# Patient Record
Sex: Male | Born: 1995 | Race: White | Hispanic: No | Marital: Single | Smoking: Former smoker
Health system: Southern US, Community
[De-identification: ages and names within clinical notes are randomized; demographics above are authoritative.]

## PROBLEM LIST (undated history)

## (undated) DIAGNOSIS — F319 Bipolar disorder, unspecified: Secondary | ICD-10-CM

## (undated) DIAGNOSIS — F410 Panic disorder [episodic paroxysmal anxiety] without agoraphobia: Secondary | ICD-10-CM

## (undated) DIAGNOSIS — I2699 Other pulmonary embolism without acute cor pulmonale: Secondary | ICD-10-CM

## (undated) HISTORY — PX: OTHER SURGICAL HISTORY: SHX169

---
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
## 2017-11-07 ENCOUNTER — Emergency Department
Admission: EM | Admit: 2017-11-07 | Discharge: 2017-11-07 | Disposition: A | Discharge status: Home / Self Care | Payer: Self-pay | Attending: Emergency Medicine | Admitting: Emergency Medicine

## 2017-11-07 ENCOUNTER — Encounter: Payer: Self-pay | Admitting: Emergency Medicine

## 2017-11-07 ENCOUNTER — Other Ambulatory Visit: Payer: Self-pay

## 2017-11-07 DIAGNOSIS — B2790 Infectious mononucleosis, unspecified without complication: Secondary | ICD-10-CM

## 2017-11-07 DIAGNOSIS — Z79899 Other long term (current) drug therapy: Secondary | ICD-10-CM | POA: Insufficient documentation

## 2017-11-07 DIAGNOSIS — F17210 Nicotine dependence, cigarettes, uncomplicated: Secondary | ICD-10-CM | POA: Insufficient documentation

## 2017-11-07 LAB — COMPREHENSIVE METABOLIC PANEL
ALBUMIN: 4.2 g/dL (ref 3.5–5.0)
ALT: 159 U/L — AB (ref 0–44)
AST: 116 U/L — AB (ref 15–41)
Alkaline Phosphatase: 103 U/L (ref 38–126)
Anion gap: 10 (ref 5–15)
BILIRUBIN TOTAL: 1 mg/dL (ref 0.3–1.2)
BUN: 9 mg/dL (ref 6–20)
CHLORIDE: 98 mmol/L (ref 98–111)
CO2: 30 mmol/L (ref 22–32)
Calcium: 9.2 mg/dL (ref 8.9–10.3)
Creatinine, Ser: 0.88 mg/dL (ref 0.61–1.24)
GFR calc Af Amer: >60 mL/min (ref >60)
GLUCOSE: 103 mg/dL — AB (ref 70–99)
POTASSIUM: 3.9 mmol/L (ref 3.5–5.1)
Sodium: 138 mmol/L (ref 135–145)
TOTAL PROTEIN: 8.3 g/dL — AB (ref 6.5–8.1)

## 2017-11-07 LAB — CBC WITH DIFFERENTIAL/PLATELET
BLASTS: 0 %
Band Neutrophils: 0 %
Basophils Absolute: 0 10*3/uL (ref 0–0.1)
Basophils Relative: 0 %
EOS PCT: 1 %
Eosinophils Absolute: 0.1 10*3/uL (ref 0–0.7)
HCT: 45.3 % (ref 40.0–52.0)
HEMOGLOBIN: 15.9 g/dL (ref 13.0–18.0)
Lymphocytes Relative: 52 %
Lymphs Abs: 6.4 10*3/uL — ABNORMAL HIGH (ref 1.0–3.6)
MCH: 28.7 pg (ref 26.0–34.0)
MCHC: 35 g/dL (ref 32.0–36.0)
MCV: 82.1 fL (ref 80.0–100.0)
MONO ABS: 1.7 10*3/uL — AB (ref 0.2–1.0)
MONOS PCT: 14 %
Metamyelocytes Relative: 0 %
Myelocytes: 1 %
NEUTROS ABS: 4.1 10*3/uL (ref 1.4–6.5)
NRBC: 0 /100{WBCs}
Neutrophils Relative %: 32 %
OTHER: 0 %
Platelets: 181 10*3/uL (ref 150–440)
Promyelocytes Relative: 0 %
RBC: 5.52 MIL/uL (ref 4.40–5.90)
RDW: 13.3 % (ref 11.5–14.5)
WBC: 12.3 10*3/uL — AB (ref 3.8–10.6)

## 2017-11-07 LAB — MONONUCLEOSIS SCREEN: Mono Screen: POSITIVE — AB

## 2017-11-07 LAB — GROUP A STREP BY PCR: Group A Strep by PCR: NOT DETECTED

## 2017-11-07 MED ORDER — DEXAMETHASONE SODIUM PHOSPHATE 10 MG/ML IJ SOLN
10.0000 mg | Freq: Once | INTRAMUSCULAR | Status: AC
  Administered 2017-11-07: 10 mg via INTRAVENOUS
  Filled 2017-11-07: qty 1

## 2017-11-07 MED ORDER — ACETAMINOPHEN 325 MG PO TABS
650.0000 mg | ORAL_TABLET | Freq: Once | ORAL | Status: AC | PRN
  Administered 2017-11-07: 650 mg via ORAL
  Filled 2017-11-07: qty 2

## 2017-11-07 NOTE — ED Provider Notes (Signed)
[facility] Emergency Department Provider Note   ____________________________________________   First MD Initiated Contact with Patient 11/07/17 1427     (approximate)  I have reviewed the triage vital signs and the nursing notes.   HISTORY  Chief Complaint Sore Throat  HPI Jason Wise is a 22 y.o. male to the emergency department with complaint of sore throat for approximately 4 days.  Patient arrives to the ED via EMS.  Patient has not taken any over-the-counter medication and complains of fever with sore throat.  He continues to drink normally.  He is unaware of any exposure to strep throat.  Currently rates his pain as an 8 out of 10.  Past Medical History:  Diagnosis Date  . Bipolar disorder (HCC)   . Panic attack     Patient Active Problem List   Diagnosis Date Noted  . Substance induced mood disorder (HCC) 05/08/2016  . Bipolar depression (HCC) 05/08/2016    History reviewed. No pertinent surgical history.  Prior to Admission medications   Medication Sig Start Date End Date Taking? Authorizing Provider  FLUoxetine (PROZAC) 10 MG capsule Take 1 capsule (10 mg total) by mouth at bedtime. 05/04/16   Myrna BlazerSchaevitz, David Matthew, MD  hydrOXYzine (ATARAX/VISTARIL) 25 MG tablet Take 1 tablet (25 mg total) by mouth 3 (three) times daily as needed. 09/09/16   Enid DerryWagner, Ashley, PA-C  OLANZapine (ZYPREXA) 2.5 MG tablet Take 0.5 tablets (1.25 mg total) by mouth at bedtime. 05/04/16 05/04/17  Schaevitz, Myra Rudeavid Matthew, MD    Allergies Patient has no known allergies.  History reviewed. No pertinent family history.  Social History Social History   Tobacco Use  . Smoking status: Current Every Day Smoker    Types: E-cigarettes, Cigarettes  . Smokeless tobacco: Never Used  Substance Use Topics  . Alcohol use: No  . Drug use: No    Review of Systems Constitutional: Positive fever/chills Eyes: No visual changes. ENT: Positive sore  throat. Cardiovascular: Denies chest pain. Respiratory: Denies shortness of breath. Gastrointestinal: No abdominal pain.  No nausea, no vomiting.  Musculoskeletal: Negative for back pain. Skin: Negative for rash. Neurological: Negative for headaches, focal weakness or numbness. ____________________________________________   PHYSICAL EXAM:  VITAL SIGNS: ED Triage Vitals  Enc Vitals Group     BP 11/07/17 1355 118/76     Pulse Rate 11/07/17 1355 (!) 128     Resp 11/07/17 1355 18     Temp 11/07/17 1355 (!) 102 F (38.9 C)     Temp Source 11/07/17 1355 Oral     SpO2 11/07/17 1355 100 %     Weight 11/07/17 1355 132 lb (59.9 kg)     Height 11/07/17 1355 5\' 9"  (1.753 m)     Head Circumference --      Peak Flow --      Pain Score 11/07/17 1410 8     Pain Loc --      Pain Edu? --      Excl. in GC? --    Constitutional: Alert and oriented. Well appearing and in no acute distress. Eyes: Conjunctivae are normal.  Head: Atraumatic. Nose: No congestion/rhinnorhea. Mouth/Throat: Mucous membranes are moist.  Oropharynx erythematous without exudate.  Uvula is midline.  Patient does not have any difficulty maintaining secretions and is drinking fluids. Neck: No stridor.   Hematological/Lymphatic/Immunilogical: Bilateral tender cervical lymphadenopathy. Cardiovascular: Normal rate, regular rhythm. Grossly normal heart sounds.  Good peripheral circulation. Respiratory: Normal respiratory effort.  No retractions. Lungs CTAB. Gastrointestinal:  Soft and nontender. No distention. Musculoskeletal: His upper and lower extremities with any difficulty.  Normal gait was noted. Neurologic:  Normal speech and language. No gross focal neurologic deficits are appreciated. No gait instability. Skin:  Skin is warm, dry and intact. No rash noted. Psychiatric: Mood and affect are normal. Speech and behavior are normal.  ____________________________________________   LABS (all labs ordered are listed, but  only abnormal results are displayed)  Labs Reviewed  COMPREHENSIVE METABOLIC PANEL - Abnormal; Notable for the following components:      Result Value   Glucose, Bld 103 (*)    Total Protein 8.3 (*)    AST 116 (*)    ALT 159 (*)    All other components within normal limits  CBC WITH DIFFERENTIAL/PLATELET - Abnormal; Notable for the following components:   WBC 12.3 (*)    Lymphs Abs 6.4 (*)    Monocytes Absolute 1.7 (*)    All other components within normal limits  MONONUCLEOSIS SCREEN - Abnormal; Notable for the following components:   Mono Screen POSITIVE (*)    All other components within normal limits  GROUP A STREP BY PCR    PROCEDURES  Procedure(s) performed: None  Procedures  Critical Care performed: No  ____________________________________________   INITIAL IMPRESSION / ASSESSMENT AND PLAN / ED COURSE  As part of my medical decision making, I reviewed the following data within the electronic MEDICAL RECORD NUMBER Notes from prior ED visits and [place] Controlled Substance Database  Patient presents to the ED with complaint of sore throat.  Patient was made aware that strep test is negative however mono test was positive.  He was made aware that he could not participate in any contact sports which he states there is no possibility of that as he is not athletic.  Patient was given Decadron 10 mg IV prior to discharge to help with throat discomfort.  He is encouraged to drink fluids and also follow-up with his PCP in approximately 3 weeks. ____________________________________________   FINAL CLINICAL IMPRESSION(S) / ED DIAGNOSES  Final diagnoses:  Infectious mononucleosis without complication, infectious mononucleosis due to unspecified organism     ED Discharge Orders    None       Note:  This document was prepared using Dragon voice recognition software and may include unintentional dictation errors.    Tommi Rumps, PA-C 11/07/17 1618    Emily Filbert, MD 11/10/17 [phone]

## 2017-11-07 NOTE — Discharge Instructions (Addendum)
Follow-up with your primary care provider or [facility] if any continued problems.  He may continue taking Tylenol or ibuprofen as needed for fever.  Increase fluids.  Eat nutritious foods and get plenty of rest.  Do not participate in any contact sports or activities which would cause a blow to your abdomen.  You should be reevaluated by your primary care provider in approximately 3 weeks.  Return to the emergency department if any severe worsening of your symptoms. Mononucleosis is a viral and will need to run its course on its own.  The steroids that you were given through your IV should help with throat discomfort.

## 2017-11-07 NOTE — ED Triage Notes (Signed)
First Nurse Notes:  Arrives via [facility] with c/o white spots on throat since Monday and fever.  EMS states temp:  99.5.

## 2018-02-19 IMAGING — DX DG ELBOW COMPLETE 3+V*R*
4 series · 4 of 4 positions shown · non-contrast
Comparison: None.

CLINICAL DATA: Pain following fall

EXAM:
RIGHT ELBOW - COMPLETE 3+ VIEW

[elbow ap]
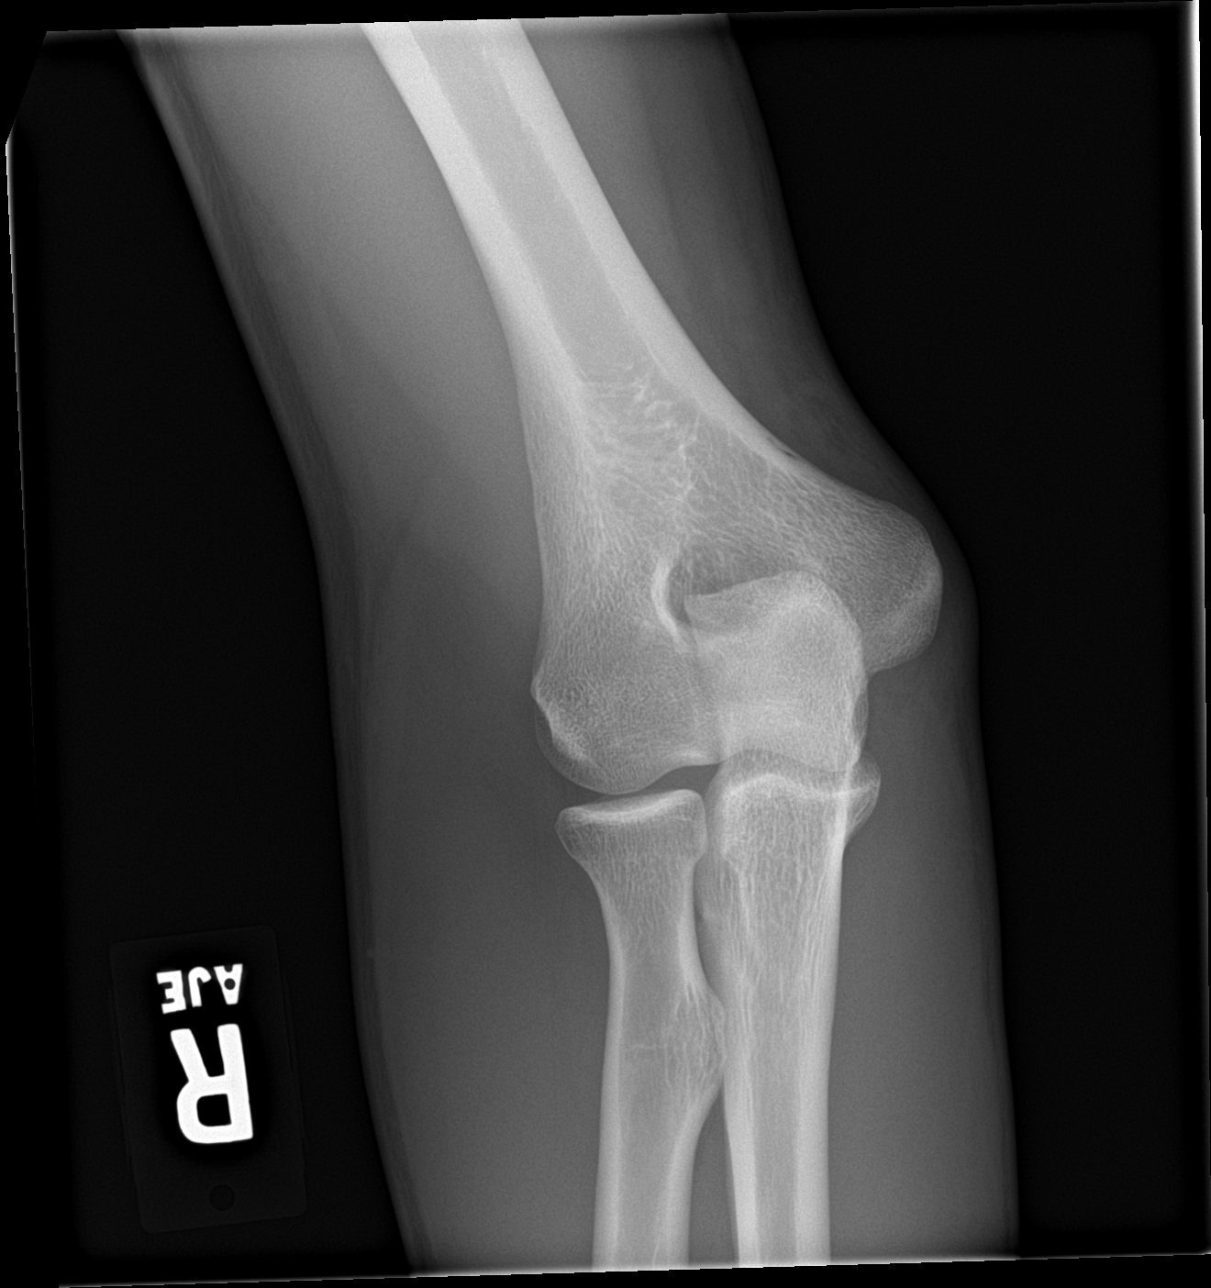

[elbow obl (1 of 2)]
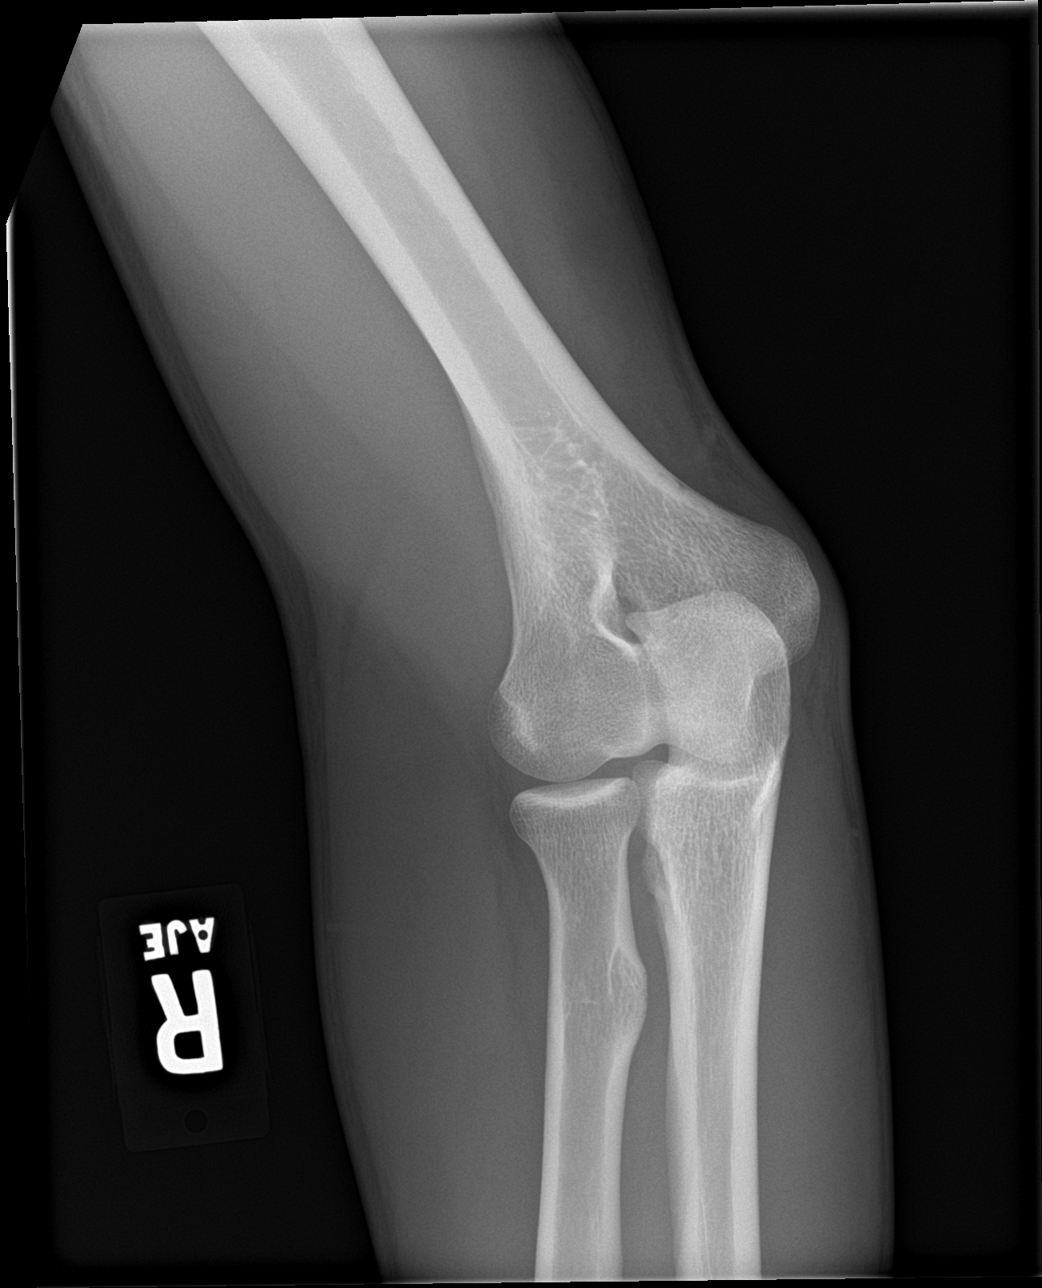

[elbow obl (2 of 2)]
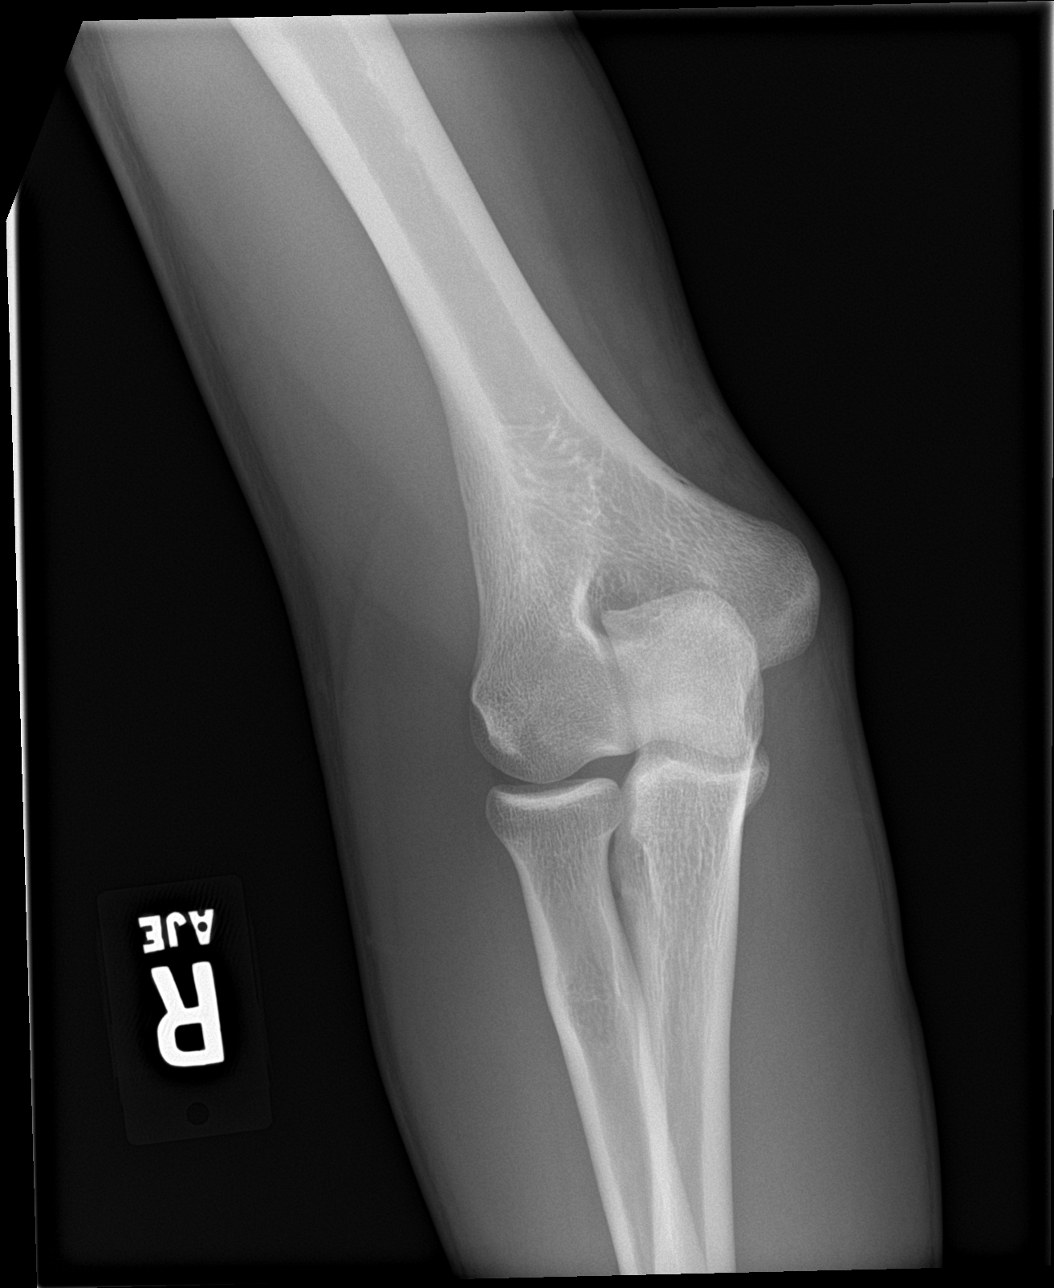

[elbow lat]
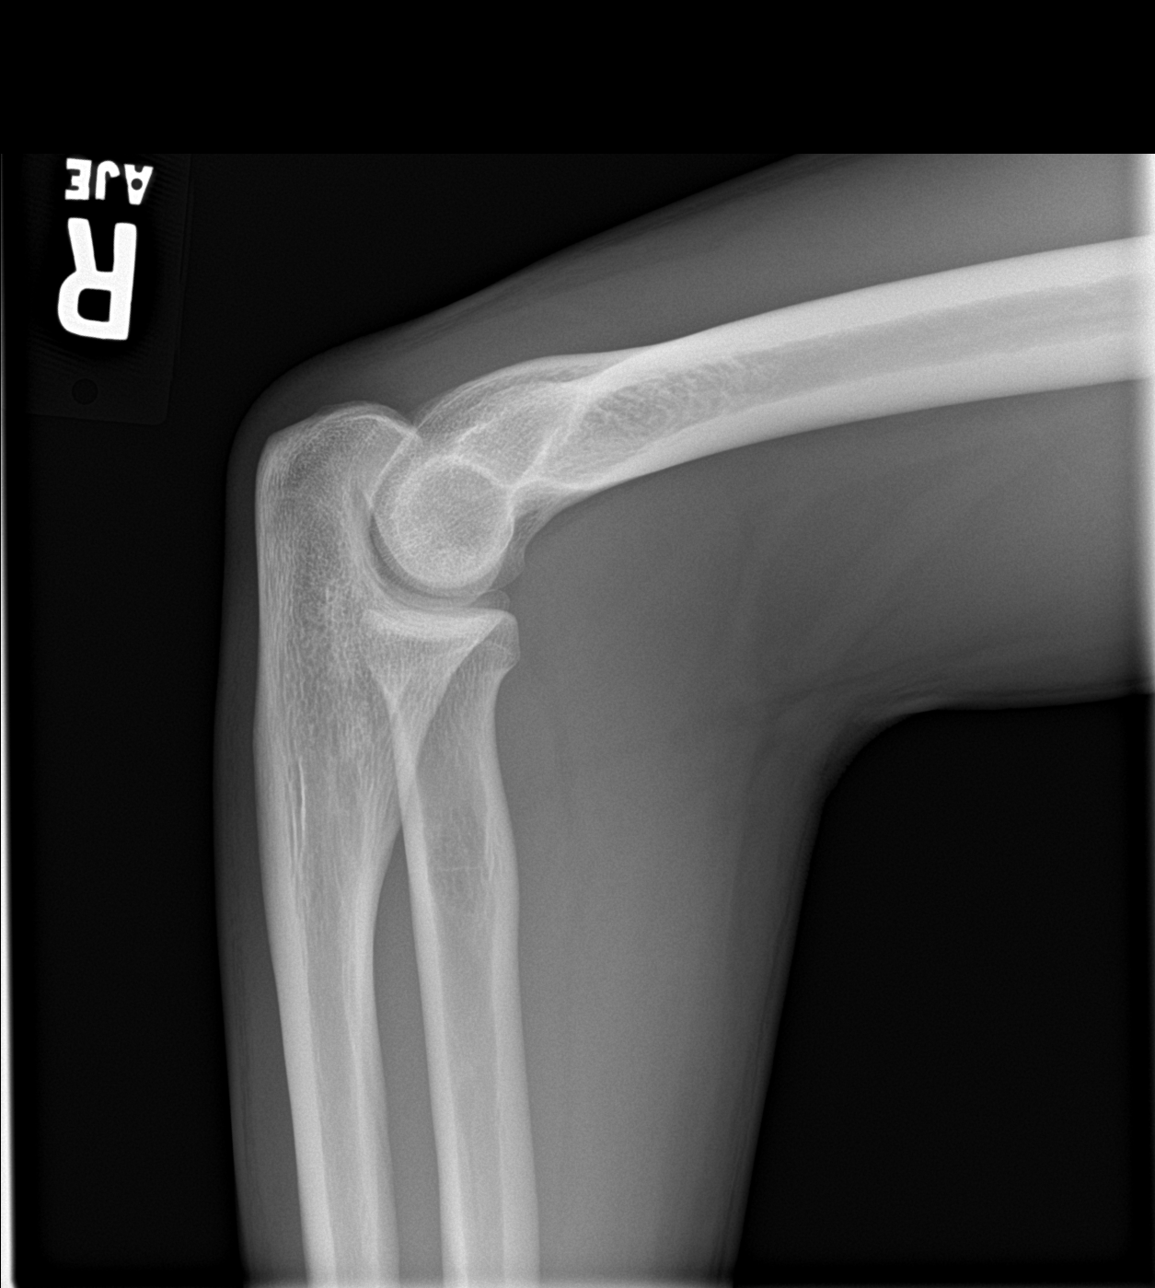

[4 of 4 positions shown; findings below may reference images not displayed]

FINDINGS: Frontal, lateral, and bilateral oblique views were obtained. There
is no fracture or dislocation. The joint spaces appear normal. No
joint effusion. No erosive change.
IMPRESSION: No fracture or dislocation.  No evident arthropathy.

## 2018-02-19 IMAGING — DX DG WRIST COMPLETE 3+V*R*
4 series · 4 of 4 positions shown · non-contrast
Comparison: None.

CLINICAL DATA: Fall during PE.  Right wrist pain and tingling.

EXAM:
RIGHT WRIST - COMPLETE 3+ VIEW

[wrist ap (1 of 2)]
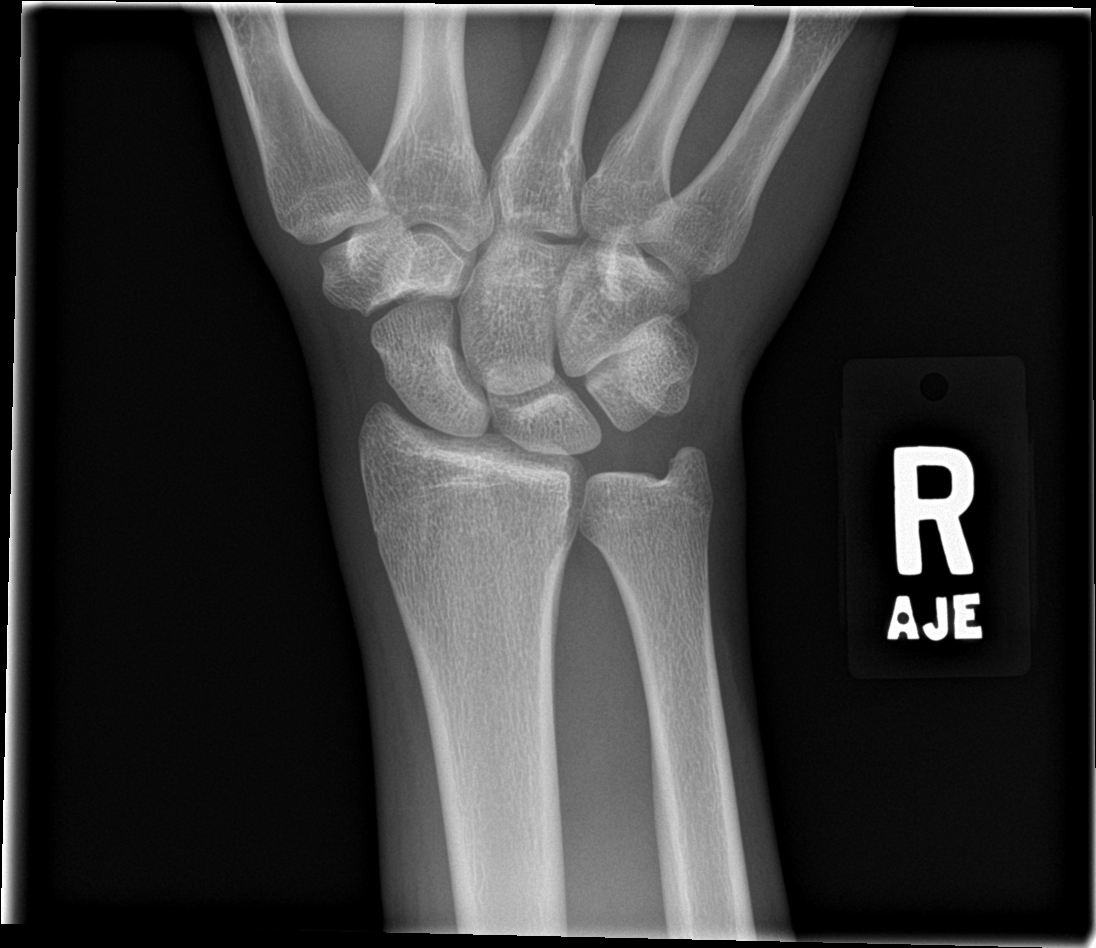

[wrist obl]
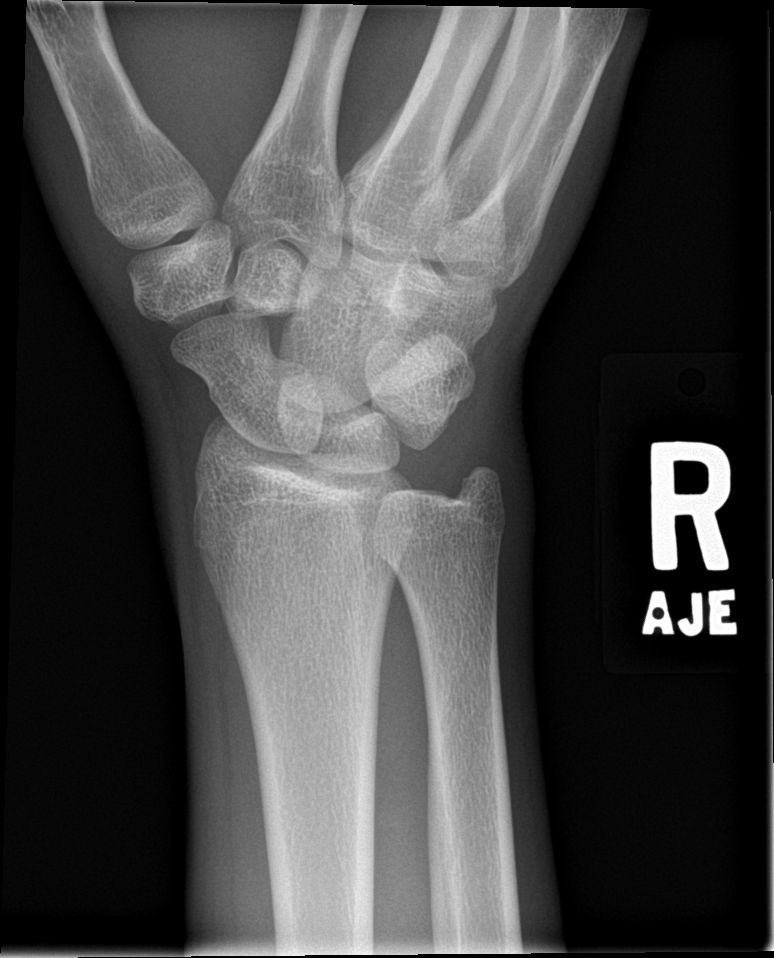

[wrist lat]
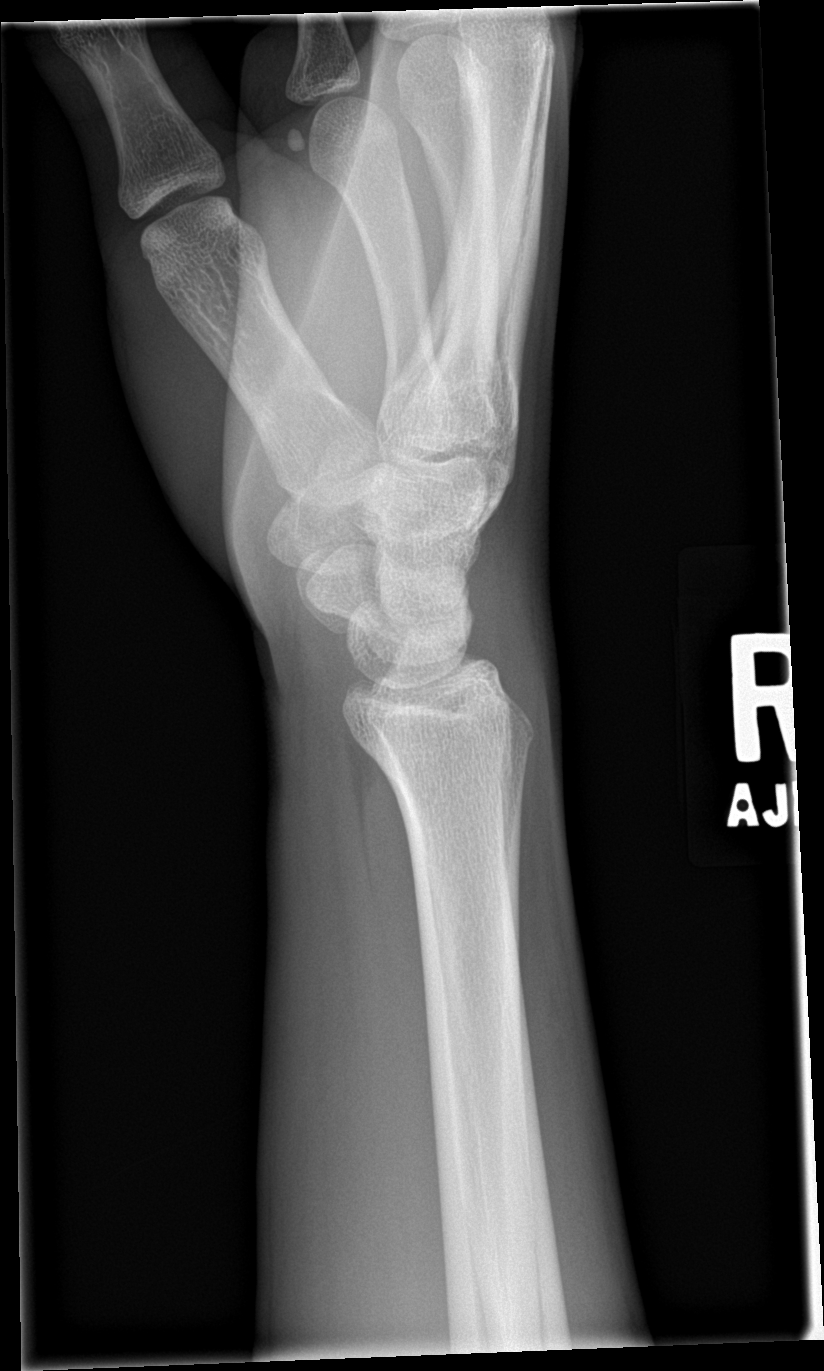

[wrist ap (2 of 2)]
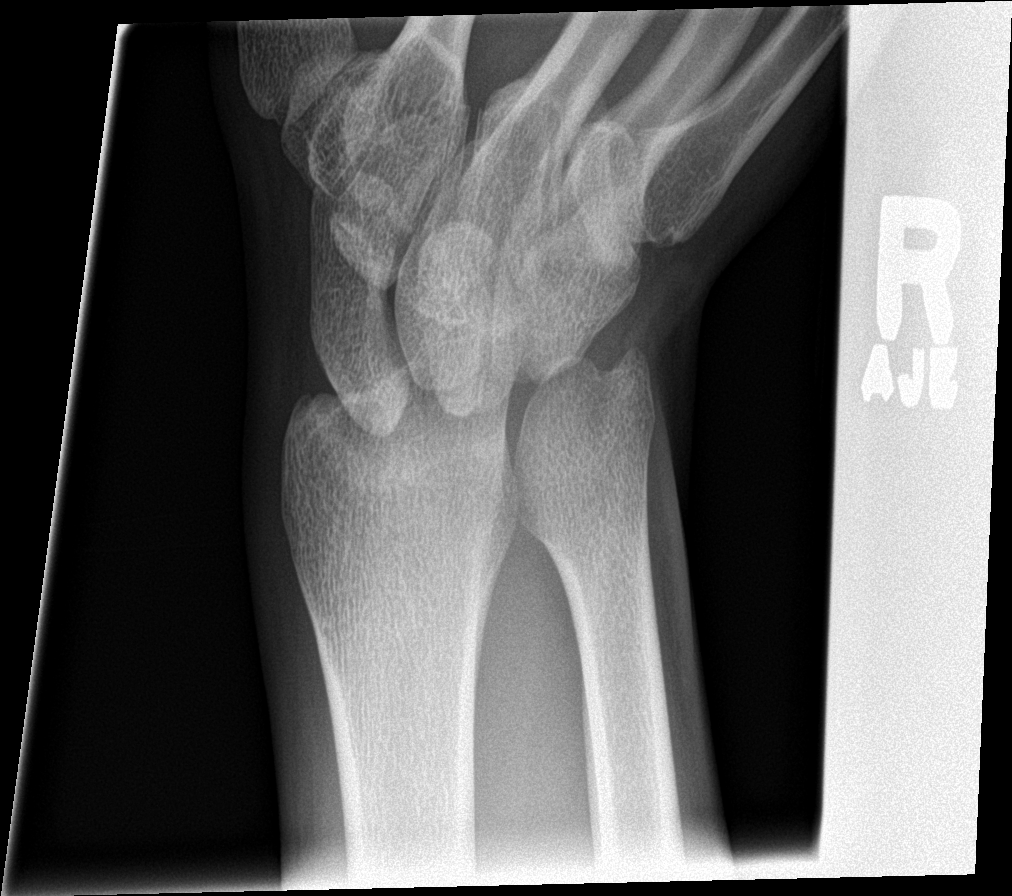

[4 of 4 positions shown; findings below may reference images not displayed]

FINDINGS: There is no evidence of fracture or dislocation. There is no
evidence of arthropathy or other focal bone abnormality. Soft
tissues are unremarkable.
IMPRESSION: Negative right wrist radiographs.

## 2018-05-12 ENCOUNTER — Emergency Department
Admission: EM | Admit: 2018-05-12 | Discharge: 2018-05-12 | Disposition: A | Discharge status: Home / Self Care | Payer: Self-pay | Attending: Emergency Medicine | Admitting: Emergency Medicine

## 2018-05-12 ENCOUNTER — Other Ambulatory Visit: Payer: Self-pay

## 2018-05-12 ENCOUNTER — Encounter: Payer: Self-pay | Admitting: Emergency Medicine

## 2018-05-12 DIAGNOSIS — F319 Bipolar disorder, unspecified: Secondary | ICD-10-CM | POA: Insufficient documentation

## 2018-05-12 DIAGNOSIS — J029 Acute pharyngitis, unspecified: Secondary | ICD-10-CM | POA: Insufficient documentation

## 2018-05-12 DIAGNOSIS — Z79899 Other long term (current) drug therapy: Secondary | ICD-10-CM | POA: Insufficient documentation

## 2018-05-12 DIAGNOSIS — R21 Rash and other nonspecific skin eruption: Secondary | ICD-10-CM

## 2018-05-12 DIAGNOSIS — F17210 Nicotine dependence, cigarettes, uncomplicated: Secondary | ICD-10-CM | POA: Insufficient documentation

## 2018-05-12 LAB — GROUP A STREP BY PCR: Group A Strep by PCR: NOT DETECTED

## 2018-05-12 MED ORDER — PREDNISONE 10 MG (21) PO TBPK: ORAL_TABLET | ORAL | 0 refills | Status: DC

## 2018-05-12 MED ORDER — AMOXICILLIN 500 MG PO CAPS: 500.0000 mg | ORAL_CAPSULE | Freq: Three times a day (TID) | ORAL | 0 refills | Status: DC

## 2018-05-12 NOTE — ED Provider Notes (Signed)
[facility] Emergency Department Provider Note  ____________________________________________   First MD Initiated Contact with Patient 05/12/18 1246     (approximate)  I have reviewed the triage vital signs and the nursing notes.   HISTORY  Chief Complaint Rash    HPI Jason Wise is a 23 y.o. male presents emergency department reporting a rash to the trunk and groin area.  He states the area appeared after he stayed in a hotel on Friday.  He states is not very itchy.  He states there is none on his hands under his arms around his waistline.  His mother states that he complained of sore throat last week.  He has had some diarrhea today.    Past Medical History:  Diagnosis Date  . Bipolar disorder (HCC)   . Panic attack     Patient Active Problem List   Diagnosis Date Noted  . Substance induced mood disorder (HCC) 05/08/2016  . Bipolar depression (HCC) 05/08/2016    History reviewed. No pertinent surgical history.  Prior to Admission medications   Medication Sig Start Date End Date Taking? Authorizing Provider  amoxicillin (AMOXIL) 500 MG capsule Take 1 capsule (500 mg total) by mouth 3 (three) times daily. 05/12/18   Fisher, Roselyn Bering, PA-C  FLUoxetine (PROZAC) 10 MG capsule Take 1 capsule (10 mg total) by mouth at bedtime. 05/04/16   Myrna Blazer, MD  hydrOXYzine (ATARAX/VISTARIL) 25 MG tablet Take 1 tablet (25 mg total) by mouth 3 (three) times daily as needed. 09/09/16   Enid Derry, PA-C  OLANZapine (ZYPREXA) 2.5 MG tablet Take 0.5 tablets (1.25 mg total) by mouth at bedtime. 05/04/16 05/04/17  Myrna Blazer, MD  predniSONE (STERAPRED UNI-PAK 21 TAB) 10 MG (21) TBPK tablet Take 6 pills on day one then decrease by 1 pill each day 05/12/18   Faythe Ghee, PA-C    Allergies Patient has no known allergies.  History reviewed. No pertinent family history.  Social History Social History   Tobacco Use  . Smoking status:  Current Every Day Smoker    Packs/day: 1.00    Types: Cigarettes  . Smokeless tobacco: Never Used  Substance Use Topics  . Alcohol use: No  . Drug use: No    Review of Systems  Constitutional: No fever/chills Eyes: No visual changes. ENT: Positive sore throat. Respiratory: Denies cough Genitourinary: Negative for dysuria. Musculoskeletal: Negative for back pain. Skin: Positive for rash.    ____________________________________________   PHYSICAL EXAM:  VITAL SIGNS: ED Triage Vitals  Enc Vitals Group     BP 05/12/18 1140 117/64     Pulse Rate 05/12/18 1140 (!) 101     Resp --      Temp 05/12/18 1140 98.6 F (37 C)     Temp Source 05/12/18 1140 Oral     SpO2 05/12/18 1140 99 %     Weight 05/12/18 1140 132 lb (59.9 kg)     Height 05/12/18 1140 5\' 9"  (1.753 m)     Head Circumference --      Peak Flow --      Pain Score 05/12/18 1145 0     Pain Loc --      Pain Edu? --      Excl. in GC? --     Constitutional: Alert and oriented. Well appearing and in no acute distress. Eyes: Conjunctivae are normal.  Head: Atraumatic. Nose: No congestion/rhinnorhea. Mouth/Throat: Mucous membranes are moist.  Throat is bright red and  swollen Neck:  supple no lymphadenopathy noted Cardiovascular: Normal rate, regular rhythm. Heart sounds are normal Respiratory: Normal respiratory effort.  No retractions, lungs c t a  Abd: soft nontender bs normal all 4 quad GU: deferred Musculoskeletal: FROM all extremities, warm and well perfused Neurologic:  Normal speech and language.  Skin:  Skin is warm, dry and intact.  The rash on the trunk has several round lesions noted, questionable like pityriasis rosea versus scarlet fever. Psychiatric: Mood and affect are normal. Speech and behavior are normal.  ____________________________________________   LABS (all labs ordered are listed, but only abnormal results are displayed)  Labs Reviewed  GROUP A STREP BY PCR    ____________________________________________   ____________________________________________  RADIOLOGY    ____________________________________________   PROCEDURES  Procedure(s) performed: No  Procedures    ____________________________________________   INITIAL IMPRESSION / ASSESSMENT AND PLAN / ED COURSE  Pertinent labs & imaging results that were available during my care of the patient were reviewed by me and considered in my medical decision making (see chart for details).   Patient is 23 year old male presents emergency department complaining of a rash, sore throat, and diarrhea.  Physical exam shows throat is bright red.  The rash is confined to the trunk and groin area.  Strep test is negative.  Due to the presentation the patient was placed on amoxicillin and Sterapred.  He is to follow-up with regular doctor if not better in 3 days.  Return emergency department worsening.  He states he understands will comply.  Is discharged stable condition.     As part of my medical decision making, I reviewed the following data within the electronic MEDICAL RECORD NUMBER History obtained from family, Nursing notes reviewed and incorporated, Labs reviewed strep test is negative, Old chart reviewed, Notes from prior ED visits and  Controlled Substance Database  ____________________________________________   FINAL CLINICAL IMPRESSION(S) / ED DIAGNOSES  Final diagnoses:  Acute pharyngitis, unspecified etiology  Rash      NEW MEDICATIONS STARTED DURING THIS VISIT:  Discharge Medication List as of 05/12/2018  2:04 PM    START taking these medications   Details  amoxicillin (AMOXIL) 500 MG capsule Take 1 capsule (500 mg total) by mouth 3 (three) times daily., Starting Tue 05/12/2018, Normal    predniSONE (STERAPRED UNI-PAK 21 TAB) 10 MG (21) TBPK tablet Take 6 pills on day one then decrease by 1 pill each day, Normal         Note:  This document was prepared using  Dragon voice recognition software and may include unintentional dictation errors.    Faythe Ghee, PA-C 05/12/18 1633    Sharyn Creamer, MD 05/12/18 [phone]

## 2018-05-12 NOTE — ED Triage Notes (Signed)
Pt presents to ED c/o rash to chest and back since Friday. Pt states rash burns slightly but is not itchy. States he was staying with a friend in a motel when rash first appeared.

## 2018-05-12 NOTE — ED Notes (Signed)
Rash like to anterior posterior trunk, reports rash radiating into groin area. Denies itch. Reports rash appeared on Friday, reports used a different type of body wash and noticed the rash after that. Rash worse to day.

## 2018-05-12 NOTE — Discharge Instructions (Addendum)
Follow up with your regular doctor or acute care if not better in 3 days.  Take the medication as prescribed.  If the rash is worsening return to the ER.

## 2018-06-24 ENCOUNTER — Encounter: Payer: Self-pay | Admitting: Emergency Medicine

## 2018-06-24 ENCOUNTER — Emergency Department: Payer: Self-pay

## 2018-06-24 ENCOUNTER — Emergency Department
Admission: EM | Admit: 2018-06-24 | Discharge: 2018-06-24 | Disposition: A | Discharge status: Home / Self Care | Payer: Self-pay | Attending: Emergency Medicine | Admitting: Emergency Medicine

## 2018-06-24 ENCOUNTER — Other Ambulatory Visit: Payer: Self-pay

## 2018-06-24 DIAGNOSIS — E860 Dehydration: Secondary | ICD-10-CM | POA: Insufficient documentation

## 2018-06-24 DIAGNOSIS — R0789 Other chest pain: Secondary | ICD-10-CM | POA: Insufficient documentation

## 2018-06-24 LAB — CBC WITH DIFFERENTIAL/PLATELET
Abs Immature Granulocytes: 0.04 10*3/uL (ref 0.00–0.07)
Basophils Absolute: 0.1 10*3/uL (ref 0.0–0.1)
Basophils Relative: 1 %
Eosinophils Absolute: 0.1 10*3/uL (ref 0.0–0.5)
Eosinophils Relative: 1 %
HCT: 49.7 % (ref 39.0–52.0)
Hemoglobin: 16.6 g/dL (ref 13.0–17.0)
Immature Granulocytes: 0 %
Lymphocytes Relative: 28 %
Lymphs Abs: 2.9 10*3/uL (ref 0.7–4.0)
MCH: 29 pg (ref 26.0–34.0)
MCHC: 33.4 g/dL (ref 30.0–36.0)
MCV: 86.9 fL (ref 80.0–100.0)
Monocytes Absolute: 0.8 10*3/uL (ref 0.1–1.0)
Monocytes Relative: 8 %
Neutro Abs: 6.3 10*3/uL (ref 1.7–7.7)
Neutrophils Relative %: 62 %
Platelets: 289 10*3/uL (ref 150–400)
RBC: 5.72 MIL/uL (ref 4.22–5.81)
RDW: 12.7 % (ref 11.5–15.5)
WBC: 10.2 10*3/uL (ref 4.0–10.5)
nRBC: 0 % (ref 0.0–0.2)

## 2018-06-24 LAB — COMPREHENSIVE METABOLIC PANEL
ALT: 12 U/L (ref 0–44)
AST: 22 U/L (ref 15–41)
Albumin: 5 g/dL (ref 3.5–5.0)
Alkaline Phosphatase: 50 U/L (ref 38–126)
Anion gap: 13 (ref 5–15)
BUN: 20 mg/dL (ref 6–20)
CO2: 25 mmol/L (ref 22–32)
Calcium: 9.8 mg/dL (ref 8.9–10.3)
Chloride: 102 mmol/L (ref 98–111)
Creatinine, Ser: 0.83 mg/dL (ref 0.61–1.24)
GFR calc Af Amer: >60 mL/min (ref >60)
GFR calc non Af Amer: >60 mL/min (ref >60)
Glucose, Bld: 109 mg/dL — ABNORMAL HIGH (ref 70–99)
Potassium: 3.8 mmol/L (ref 3.5–5.1)
Sodium: 140 mmol/L (ref 135–145)
Total Bilirubin: 0.4 mg/dL (ref 0.3–1.2)
Total Protein: 8 g/dL (ref 6.5–8.1)

## 2018-06-24 LAB — FIBRIN DERIVATIVES D-DIMER (ARMC ONLY): Fibrin derivatives D-dimer (ARMC): 120.99 ng/mL (FEU) (ref 0.00–499.00)

## 2018-06-24 MED ORDER — SODIUM CHLORIDE 0.9 % IV BOLUS
1000.0000 mL | Freq: Once | INTRAVENOUS | Status: AC
  Administered 2018-06-24: 1000 mL via INTRAVENOUS

## 2018-06-24 MED ORDER — KETOROLAC TROMETHAMINE 30 MG/ML IJ SOLN
15.0000 mg | INTRAMUSCULAR | Status: DC
  Filled 2018-06-24: qty 1

## 2018-06-24 NOTE — ED Notes (Signed)
Pt verbalized understanding of d/c instructions, and f/u care. No further questions at this time. Pt ambulatory to the exit with steady gait.  

## 2018-06-24 NOTE — ED Provider Notes (Signed)
[facility] Emergency Department Provider Note  ____________________________________________  Time seen: Approximately 6:32 PM  I have reviewed the triage vital signs and the nursing notes.   HISTORY  Chief Complaint Chest Pain    HPI Jason Wise is a 23 y.o. male with a history of bipolar disorder, panic attack, and substance abuse who reports he was riding a bike today after about 10 minutes had a sudden onset of severe left-sided chest pain that was sharp, constant, now improved to 5/10.  Nonradiating.  Not associated with shortness of breath diaphoresis or vomiting.  No palpitations dizziness or syncope.  Never had anything like this before. Denies recent travel trauma hospitalization surgery or prior DVT or PE.   Denies any past medical history or surgeries.  Reports that he takes no medications currently.  Denies hallucinations. Patient reports he has not been drinking water much lately and may be dehydrated.  Past Medical History:  Diagnosis Date  . Bipolar disorder (HCC)   . Panic attack      Patient Active Problem List   Diagnosis Date Noted  . Substance induced mood disorder (HCC) 05/08/2016  . Bipolar depression (HCC) 05/08/2016     History reviewed. No pertinent surgical history.   Prior to Admission medications   Medication Sig Start Date End Date Taking? Authorizing Provider  amoxicillin (AMOXIL) 500 MG capsule Take 1 capsule (500 mg total) by mouth 3 (three) times daily. Patient not taking: Reported on 06/24/2018 05/12/18   Faythe GheeFisher, Susan W, PA-C  FLUoxetine (PROZAC) 10 MG capsule Take 1 capsule (10 mg total) by mouth at bedtime. Patient not taking: Reported on 06/24/2018 05/04/16   Myrna BlazerSchaevitz, David Matthew, MD  hydrOXYzine (ATARAX/VISTARIL) 25 MG tablet Take 1 tablet (25 mg total) by mouth 3 (three) times daily as needed. Patient not taking: Reported on 06/24/2018 09/09/16   Enid DerryWagner, Ashley, PA-C  OLANZapine (ZYPREXA) 2.5 MG tablet Take  0.5 tablets (1.25 mg total) by mouth at bedtime. 05/04/16 05/04/17  Myrna BlazerSchaevitz, David Matthew, MD  predniSONE (STERAPRED UNI-PAK 21 TAB) 10 MG (21) TBPK tablet Take 6 pills on day one then decrease by 1 pill each day Patient not taking: Reported on 06/24/2018 05/12/18   Faythe GheeFisher, Susan W, PA-C     Allergies Patient has no known allergies.   History reviewed. No pertinent family history.  Social History Social History   Tobacco Use  . Smoking status: Current Every Day Smoker    Packs/day: 1.00    Types: Cigarettes  . Smokeless tobacco: Never Used  Substance Use Topics  . Alcohol use: No  . Drug use: No  Occasional marijuana use.  Denies cocaine or amphetamines.  Review of Systems  Constitutional:   No fever or chills.  ENT:   No sore throat. No rhinorrhea. Cardiovascular:   Positive as above chest pain without syncope. Respiratory:   No dyspnea or cough. Gastrointestinal:   Negative for abdominal pain, vomiting and diarrhea.  Musculoskeletal:   Negative for focal pain or swelling All other systems reviewed and are negative except as documented above in ROS and HPI.  ____________________________________________   PHYSICAL EXAM:  VITAL SIGNS: ED Triage Vitals  Enc Vitals Group     BP 06/24/18 1820 (!) 130/92     Pulse Rate 06/24/18 1823 (!) 116     Resp 06/24/18 1823 14     Temp 06/24/18 1820 98.3 F (36.8 C)     Temp Source 06/24/18 1820 Oral     SpO2 06/24/18 1823  96 %     Weight 06/24/18 1820 132 lb 0.9 oz (59.9 kg)     Height 06/24/18 1820 5\' 9"  (1.753 m)     Head Circumference --      Peak Flow --      Pain Score 06/24/18 1819 5     Pain Loc --      Pain Edu? --      Excl. in GC? --     Vital signs reviewed, nursing assessments reviewed.   Constitutional:   Alert and oriented. Non-toxic appearance. Eyes:   Conjunctivae are normal. EOMI. PERRL. ENT      Head:   Normocephalic and atraumatic.      Nose:   No congestion/rhinnorhea.       Mouth/Throat:    Tachycardia heart rate 120, no pharyngeal erythema. No peritonsillar mass.       Neck:   No meningismus. Full ROM. Hematological/Lymphatic/Immunilogical:   No cervical lymphadenopathy. Cardiovascular:   RRR. Symmetric bilateral radial and DP pulses.  No murmurs. Cap refill less than 2 seconds. Respiratory:   Normal respiratory effort without tachypnea/retractions. Breath sounds are clear and equal bilaterally. No wheezes/rales/rhonchi. Gastrointestinal:   Soft and nontender. Non distended. There is no CVA tenderness.  No rebound, rigidity, or guarding.  Musculoskeletal:   Normal range of motion in all extremities. No joint effusions.  No lower extremity tenderness.  No edema. Neurologic:   Normal speech and language.  Motor grossly intact. No acute focal neurologic deficits are appreciated.  Skin:    Skin is warm, dry and intact. No rash noted.  No petechiae, purpura, or bullae.  ____________________________________________    LABS (pertinent positives/negatives) (all labs ordered are listed, but only abnormal results are displayed) Labs Reviewed  COMPREHENSIVE METABOLIC PANEL - Abnormal; Notable for the following components:      Result Value   Glucose, Bld 109 (*)    All other components within normal limits  CBC WITH DIFFERENTIAL/PLATELET  FIBRIN DERIVATIVES D-DIMER ([facility] ONLY)   ____________________________________________   EKG  Interpreted by me Sinus tachycardia rate 109, right axis, normal intervals.  Normal QRS ST segments and T waves.  Not consistent with right heart strain.  ____________________________________________    RADIOLOGY  Dg Chest Portable 1 View  Result Date: 06/24/2018 CLINICAL DATA:  23 y/o  M; chest pain, tachycardia. EXAM: PORTABLE CHEST 1 VIEW COMPARISON:  05/04/2016 chest radiograph. FINDINGS: Stable heart size and mediastinal contours are within normal limits. Both lungs are clear. The visualized skeletal structures are unremarkable. IMPRESSION:  No active disease. Electronically Signed   By: Mitzi Hansen M.D.   On: 06/24/2018 19:10    ____________________________________________   PROCEDURES Procedures  ____________________________________________  DIFFERENTIAL DIAGNOSIS   Pneumothorax, pleurisy, chest wall muscle spasm, pulmonary embolism  CLINICAL IMPRESSION / ASSESSMENT AND PLAN / ED COURSE  Medications ordered in the ED: Medications  ketorolac (TORADOL) 30 MG/ML injection 15 mg (15 mg Intravenous Refused 06/24/18 1832)  sodium chloride 0.9 % bolus 1,000 mL (1,000 mLs Intravenous New Bag/Given 06/24/18 1832)    Pertinent labs & imaging results that were available during my care of the patient were reviewed by me and considered in my medical decision making (see chart for details).  Jason Wise was evaluated in Emergency Department on 06/24/2018 for the symptoms described in the history of present illness. He was evaluated in the context of the global COVID-19 pandemic, which necessitated consideration that the patient might be at risk for infection with the SARS-CoV-2  virus that causes COVID-19. Institutional protocols and algorithms that pertain to the evaluation of patients at risk for COVID-19 are in a state of rapid change based on information released by regulatory bodies including the CDC and federal and state organizations. These policies and algorithms were followed during the patient's care in the ED.   Patient overall nontoxic, but presents with sudden onset of sharp chest pain and tachycardia.  Exam is unremarkable except for tachycardia.  Will check chest x-ray and labs including d-dimer.  IV fluids for hydration.   ----------------------------------------- 8:21 PM on 06/24/2018 -----------------------------------------  Work-up negative.  D-dimer unremarkable.  On reassessment patient reports that his pain is entirely gone.  Heart rate is improved to 100 after IV fluids.  He feels much better and  is eager to be discharged, and he is stable for outpatient follow-up.  Low suspicion for ACS dissection AAA pneumothorax pericarditis or pneumonia.     ____________________________________________   FINAL CLINICAL IMPRESSION(S) / ED DIAGNOSES    Final diagnoses:  Atypical chest pain  Dehydration     ED Discharge Orders    None      Portions of this note were generated with dragon dictation software. Dictation errors may occur despite best attempts at proofreading.   Sharman Cheek, MD 06/24/18 2021

## 2018-06-24 NOTE — ED Notes (Signed)
X-ray at bedside

## 2018-06-24 NOTE — ED Triage Notes (Signed)
Pt presents from home via [facility] with c/o chest pain while riding bike. Pt denies shortness of breath or fevers. Pt states pain is in upper left chest. pt describes pain as sharp.

## 2018-12-29 ENCOUNTER — Encounter: Payer: Self-pay | Admitting: Emergency Medicine

## 2018-12-29 ENCOUNTER — Other Ambulatory Visit: Payer: Self-pay

## 2018-12-29 ENCOUNTER — Emergency Department
Admission: EM | Admit: 2018-12-29 | Discharge: 2018-12-29 | Disposition: A | Discharge status: Other Acute Inpatient Hospital | Payer: Self-pay | Attending: Emergency Medicine | Admitting: Emergency Medicine

## 2018-12-29 DIAGNOSIS — F4320 Adjustment disorder, unspecified: Secondary | ICD-10-CM | POA: Insufficient documentation

## 2018-12-29 DIAGNOSIS — Z23 Encounter for immunization: Secondary | ICD-10-CM | POA: Insufficient documentation

## 2018-12-29 DIAGNOSIS — F329 Major depressive disorder, single episode, unspecified: Secondary | ICD-10-CM | POA: Insufficient documentation

## 2018-12-29 DIAGNOSIS — F419 Anxiety disorder, unspecified: Secondary | ICD-10-CM | POA: Insufficient documentation

## 2018-12-29 DIAGNOSIS — F32A Depression, unspecified: Secondary | ICD-10-CM

## 2018-12-29 DIAGNOSIS — F17210 Nicotine dependence, cigarettes, uncomplicated: Secondary | ICD-10-CM | POA: Insufficient documentation

## 2018-12-29 DIAGNOSIS — F1994 Other psychoactive substance use, unspecified with psychoactive substance-induced mood disorder: Secondary | ICD-10-CM | POA: Diagnosis present

## 2018-12-29 DIAGNOSIS — F319 Bipolar disorder, unspecified: Secondary | ICD-10-CM | POA: Diagnosis present

## 2018-12-29 DIAGNOSIS — R45851 Suicidal ideations: Secondary | ICD-10-CM

## 2018-12-29 DIAGNOSIS — Z20828 Contact with and (suspected) exposure to other viral communicable diseases: Secondary | ICD-10-CM | POA: Insufficient documentation

## 2018-12-29 LAB — COMPREHENSIVE METABOLIC PANEL
ALT: 14 U/L (ref 0–44)
AST: 21 U/L (ref 15–41)
Albumin: 4.8 g/dL (ref 3.5–5.0)
Alkaline Phosphatase: 54 U/L (ref 38–126)
Anion gap: 9 (ref 5–15)
BUN: 11 mg/dL (ref 6–20)
CO2: 31 mmol/L (ref 22–32)
Calcium: 9.8 mg/dL (ref 8.9–10.3)
Chloride: 102 mmol/L (ref 98–111)
Creatinine, Ser: 0.96 mg/dL (ref 0.61–1.24)
GFR calc Af Amer: >60 mL/min (ref >60)
GFR calc non Af Amer: >60 mL/min (ref >60)
Glucose, Bld: 119 mg/dL — ABNORMAL HIGH (ref 70–99)
Potassium: 4.3 mmol/L (ref 3.5–5.1)
Sodium: 142 mmol/L (ref 135–145)
Total Bilirubin: 0.9 mg/dL (ref 0.3–1.2)
Total Protein: 7.8 g/dL (ref 6.5–8.1)

## 2018-12-29 LAB — URINE DRUG SCREEN, QUALITATIVE (ARMC ONLY)
Amphetamines, Ur Screen: NOT DETECTED
Barbiturates, Ur Screen: NOT DETECTED
Benzodiazepine, Ur Scrn: NOT DETECTED
Cannabinoid 50 Ng, Ur ~~LOC~~: POSITIVE — AB
Cocaine Metabolite,Ur ~~LOC~~: NOT DETECTED
MDMA (Ecstasy)Ur Screen: NOT DETECTED
Methadone Scn, Ur: NOT DETECTED
Opiate, Ur Screen: NOT DETECTED
Phencyclidine (PCP) Ur S: NOT DETECTED
Tricyclic, Ur Screen: NOT DETECTED

## 2018-12-29 LAB — CBC
HCT: 48.9 % (ref 39.0–52.0)
Hemoglobin: 16.2 g/dL (ref 13.0–17.0)
MCH: 28.8 pg (ref 26.0–34.0)
MCHC: 33.1 g/dL (ref 30.0–36.0)
MCV: 87 fL (ref 80.0–100.0)
Platelets: 288 10*3/uL (ref 150–400)
RBC: 5.62 MIL/uL (ref 4.22–5.81)
RDW: 12.8 % (ref 11.5–15.5)
WBC: 17.6 10*3/uL — ABNORMAL HIGH (ref 4.0–10.5)
nRBC: 0 % (ref 0.0–0.2)

## 2018-12-29 LAB — ETHANOL: Alcohol, Ethyl (B): <10 mg/dL (ref <10)

## 2018-12-29 LAB — SALICYLATE LEVEL: Salicylate Lvl: <7 mg/dL (ref 2.8–30.0)

## 2018-12-29 LAB — SARS CORONAVIRUS 2 BY RT PCR (HOSPITAL ORDER, PERFORMED IN ~~LOC~~ HOSPITAL LAB): SARS Coronavirus 2: NEGATIVE

## 2018-12-29 LAB — ACETAMINOPHEN LEVEL: Acetaminophen (Tylenol), Serum: <10 ug/mL — ABNORMAL LOW (ref 10–30)

## 2018-12-29 MED ORDER — HYDROXYZINE HCL 25 MG PO TABS: 25.0000 mg | ORAL_TABLET | Freq: Four times a day (QID) | ORAL | Status: DC | PRN

## 2018-12-29 MED ORDER — TETANUS-DIPHTH-ACELL PERTUSSIS 5-2.5-18.5 LF-MCG/0.5 IM SUSP
0.5000 mL | Freq: Once | INTRAMUSCULAR | Status: AC
  Administered 2018-12-29: 0.5 mL via INTRAMUSCULAR
  Filled 2018-12-29: qty 0.5

## 2018-12-29 MED ORDER — NICOTINE 21 MG/24HR TD PT24
21.0000 mg | MEDICATED_PATCH | Freq: Once | TRANSDERMAL | Status: DC
  Administered 2018-12-29: 21 mg via TRANSDERMAL
  Filled 2018-12-29: qty 1

## 2018-12-29 MED ORDER — LORAZEPAM 1 MG PO TABS
1.0000 mg | ORAL_TABLET | Freq: Once | ORAL | Status: AC
  Administered 2018-12-29: 1 mg via ORAL
  Filled 2018-12-29: qty 1

## 2018-12-29 NOTE — ED Provider Notes (Signed)
-----------------------------------------   2:52 PM on 12/29/2018 -----------------------------------------  Blood pressure 121/75, pulse 77, temperature 98.6 F (37 C), temperature source Oral, resp. rate 18, height 5\' 9"  (1.753 m), weight 62.1 kg, SpO2 100 %.  Plan for patient to be admitted to behavioral health unit following evaluation by psychiatry.  At this time, patient is requesting to leave although there is still concern for his suicidal ideation and he still represents a threat to himself.  Will place patient under IVC.  Patient reevaluated by psychiatry, who agreed with plan and will proceed with admission.   Blake Divine, MD 12/29/18 [phone]

## 2018-12-29 NOTE — ED Notes (Signed)
Hourly rounding reveals patient in room. No complaints, stable, in no acute distress. Q15 minute rounds and monitoring via Rover and Officer to continue.   

## 2018-12-29 NOTE — ED Notes (Signed)
BEHAVIORAL HEALTH ROUNDING Patient sleeping: Yes.   Patient alert and oriented: eyes closed  Appears asleep Behavior appropriate: Yes.  ; If no, describe:  Nutrition and fluids offered: Yes  Toileting and hygiene offered: sleeping Sitter present: q 15 minute observations  Law enforcement present: yes  ACSD  ENVIRONMENTAL ASSESSMENT Potentially harmful objects out of patient reach: Yes.   Personal belongings secured: Yes.   Patient dressed in hospital provided attire only: Yes.   Plastic bags out of patient reach: Yes.   Patient care equipment (cords, cables, call bells, lines, and drains) shortened, removed, or accounted for: Yes.   Equipment and supplies removed from bottom of stretcher: Yes.   Potentially toxic materials out of patient reach: Yes.   Sharps container removed or out of patient reach: Yes.   

## 2018-12-29 NOTE — ED Notes (Signed)
BEHAVIORAL HEALTH ROUNDING Patient sleeping: No. Patient alert and oriented: yes Behavior appropriate: Yes.  ; If no, describe:  Nutrition and fluids offered: yes Toileting and hygiene offered: Yes  Sitter present: q15 minute observations  

## 2018-12-29 NOTE — ED Notes (Signed)
Referral information for Psychiatric Hospitalization faxed to;   . [facility] ([phone]-or- [phone]),   . [facility] [phone] -or- [phone]) [phone]  . [facility] ([phone]---[phone]---[phone]),  . [facility] ([phone], [phone], [phone] or [phone]),   . [place] [facility] ([phone] or [phone])  . [facility] ([phone]),   . [facility] ([phone] -or- [phone]),   . [facility] ([phone] or [phone])  . [facility] ([phone])  

## 2018-12-29 NOTE — ED Notes (Signed)
Patient observed lying in bed with eyes closed  Even, unlabored respirations observed   NAD pt appears to be sleeping  I will continue to monitor along with every 15 minute visual observations     

## 2018-12-29 NOTE — ED Notes (Signed)
With male officer present, pt removes black footies, white tennis shoes, green t-shirt, red/black jacket, gold tone necklace & earring; pt to keep jeans and cloth belt; placed burgandy t-shirt on pt

## 2018-12-29 NOTE — ED Notes (Signed)
[facility]  DEPT  CALLED  FOR  TRANSFER 

## 2018-12-29 NOTE — ED Notes (Addendum)
ED Is the patient under IVC or is there intent for IVC:  Voluntary  Is the patient medically cleared: Yes.   Is there vacancy in the ED BHU: Yes.   Is the population mix appropriate for patient: Yes.   Is the patient awaiting placement in inpatient or outpatient setting: Yes.  Awaiting placement on [facility]U  Signed and held orders are in place  Has the patient had a psychiatric consult: Yes.   Survey of unit performed for contraband, proper placement and condition of furniture, tampering with fixtures in bathroom, shower, and each patient room: Yes.  ; Findings:  APPEARANCE/BEHAVIOR Calm and cooperative NEURO ASSESSMENT Orientation: oriented x3  Denies pain Hallucinations: No.None noted (Hallucinations) denies  Speech: Normal Gait: normal RESPIRATORY ASSESSMENT Even  Unlabored respirations  CARDIOVASCULAR ASSESSMENT Pulses equal   regular rate  Skin warm and dry   GASTROINTESTINAL ASSESSMENT no GI complaint EXTREMITIES Full ROM  PLAN OF CARE Provide calm/safe environment. Vital signs assessed twice daily. ED BHU Assessment once each 12-hour shift. Collaborate with TTS daily or as condition indicates. Assure the ED provider has rounded once each shift. Provide and encourage hygiene. Provide redirection as needed. Assess for escalating behavior; address immediately and inform ED provider.  Assess family dynamic and appropriateness for visitation as needed: Yes.  ; If necessary, describe findings:  Educate the patient/family about BHU procedures/visitation: Yes.  ; If necessary, describe findings:

## 2018-12-29 NOTE — ED Notes (Signed)
Pt  Placed under  ivc papers  Informed  Amy  Teague  RN

## 2018-12-29 NOTE — BH Assessment (Signed)
This Probation officer received a call from pt's nurse and states patient is requesting to be seen by psych after being seen by psych and assessed within the last 24 hours. This information was relayed to ED psych.

## 2018-12-29 NOTE — ED Notes (Signed)
BEHAVIORAL HEALTH ROUNDING Patient sleeping: Yes.   Patient alert and oriented: eyes closed  Appears asleep Behavior appropriate: Yes.  ; If no, describe:  Nutrition and fluids offered: Yes  Toileting and hygiene offered: sleeping Sitter present: q 15 minute observations  Law enforcement present: yes   

## 2018-12-29 NOTE — Consult Note (Signed)
[facility]   Reason for Consult: Suicidal ideation Referring Physician: Dr. York CeriseForbach Patient Identification: Jason Groomsndrew T Haycraft MRN:  [id number] Principal Diagnosis: Suicidal ideation Diagnosis:  Principal Problem:   Suicidal ideation Active Problems:   Substance induced mood disorder (HCC)   Bipolar depression (HCC)   Total Time spent with patient: 45 minutes  Subjective: "I was dating an underage girl, and I got arrested.  I now have 2 class C felonies for dating her." Jason Groomsndrew T Polizzi is a [age] y.o. male patient [facility] ED via law enforcement voluntary for suicidal ideation with a plan and self injurious behavior (with many cut marks on left upper arm). The patient states he was depressed due to his legal charges pending dating an underaged male (21 years old) last summer.  He says that he was 23 years old during that period and did not realize it was a problem because her family approved of them dating.  The patient states it was one evening he was having medical issues and called the ambulance to come and get him, and when they got there, the young victim was in the background crying.  He voiced police intervened and realized the girl was a minor, and the patient is an adult having a girl in an unsafe environment.  He stated he went to jail.  He reports doing that time.  His dad died suddenly due to him choking on a mini Twix bar.  He noted that his dad passed away in July 2019.  The patient states he is unemployed, unable to locate a job due to his pending charges.  He says he dropped out of high school three months shy of graduating due to increased panic attacks while in school. The patient was seen face-to-face by this provider; chart reviewed and consulted with Dr. York CeriseForbach on 12/29/2018 due to the patient's care. It was discussed with her EDP that the patient does meet the criteria to be admitted to the psychiatric inpatient unit.  The patient is alert and  oriented x4, anxious, cooperative, and mood-congruent with affect on evaluation. The patient does not appear to be responding to internal or external stimuli. Neither is the patient presenting with any delusional thinking. The patient admits to auditory and visual hallucinations.  The patient discloses the voices he hears are muffled and not saying anything directly to him.  He states his visual hallucinations most time he sees his deceased father staring at him, but nothing is said.  The patient admits to suicidal and self-harm ideations but denies homicidal ideations. The patient is not presenting with any psychotic or paranoid behaviors. During an encounter with the patient, he was able to answer questions appropriately.  Plan: The patient is a safety risk to self and does require psychiatric inpatient admission for stabilization and treatment. HPI: per Dr. York CeriseForbach; Jason Wise is a 23 y.o. male who reports a history of bipolar disorder and panic attacks who presents voluntarily accompanied by law enforcement for evaluation of depression and suicidal ideation.  He says that he has been depressed for a long time, months and months, but just recently over the last 24 hours ago it became so severe that he was having thoughts of killing himself.  When asked about specific methods he discussed, he said "a lot of them".  He knew he needed help so he called 911.  He denies alcohol use and says that he smokes marijuana but no other drugs.  He denies any  contact with COVID-19 patients.  He denies fever/chills, sore throat, chest pain, shortness of breath, cough, nausea, vomiting, abdominal pain, and dysuria.  Nothing particular makes his symptoms better or worse.  He says he does not have a psychiatrist and does not take any psychiatric medications at this time (although multiple psychiatric medications are listed on his medical history).  Of note, the patient has a number of superficial scratches on his left upper  arm that he said he did to try to feel something.    Past Psychiatric History:  Bipolar disorder ((HCC) Panic attack  Risk to Self:  Yes Risk to Others:  No Prior Inpatient Therapy:  Yes Prior Outpatient Therapy:   No  Past Medical History:  Past Medical History:  Diagnosis Date  . Bipolar disorder (HCC)   . Panic attack    History reviewed. No pertinent surgical history. Family History: No family history on file. Family Psychiatric  History: Unknown Social History:  Social History   Substance and Sexual Activity  Alcohol Use No     Social History   Substance and Sexual Activity  Drug Use No    Social History   Socioeconomic History  . Marital status: Single    Spouse name: Not on file  . Number of children: Not on file  . Years of education: Not on file  . Highest education level: Not on file  Occupational History  . Not on file  Social Needs  . Financial resource strain: Not on file  . Food insecurity    Worry: Not on file    Inability: Not on file  . Transportation needs    Medical: Not on file    Non-medical: Not on file  Tobacco Use  . Smoking status: Current Every Day Smoker    Packs/day: 1.00    Types: Cigarettes  . Smokeless tobacco: Never Used  Substance and Sexual Activity  . Alcohol use: No  . Drug use: No  . Sexual activity: Not on file  Lifestyle  . Physical activity    Days per week: Not on file    Minutes per session: Not on file  . Stress: Not on file  Relationships  . Social Musician on phone: Not on file    Gets together: Not on file    Attends religious service: Not on file    Active member of club or organization: Not on file    Attends meetings of clubs or organizations: Not on file    Relationship status: Not on file  Other Topics Concern  . Not on file  Social History Narrative  . Not on file   Additional Social History:    Allergies:  No Known Allergies  Labs:  Results for orders placed or performed  during the hospital encounter of 12/29/18 (from the past 48 hour(s))  Comprehensive metabolic panel     Status: Abnormal   Collection Time: 12/29/18  3:25 AM  Result Value Ref Range   Sodium 142 135 - 145 mmol/L   Potassium 4.3 3.5 - 5.1 mmol/L   Chloride 102 98 - 111 mmol/L   CO2 31 22 - 32 mmol/L   Glucose, Bld 119 (H) 70 - 99 mg/dL   BUN 11 6 - 20 mg/dL   Creatinine, Ser 3.90 0.61 - 1.24 mg/dL   Calcium 9.8 8.9 - 30.0 mg/dL   Total Protein 7.8 6.5 - 8.1 g/dL   Albumin 4.8 3.5 - 5.0 g/dL   AST  21 15 - 41 U/L   ALT 14 0 - 44 U/L   Alkaline Phosphatase 54 38 - 126 U/L   Total Bilirubin 0.9 0.3 - 1.2 mg/dL   GFR calc non Af Amer >60 >60 mL/min   GFR calc Af Amer >60 >60 mL/min   Anion gap 9 5 - 15    Comment: Performed at [facility], [address] [place], [place] [address]  Ethanol     Status: None   Collection Time: 12/29/18  3:25 AM  Result Value Ref Range   Alcohol, Ethyl (B) <10 <10 mg/dL    Comment: (NOTE) Lowest detectable limit for serum alcohol is 10 mg/dL. For medical purposes only. Performed at [facility] [address] [place], [place] [address]   Salicylate level     Status: None   Collection Time: 12/29/18  3:25 AM  Result Value Ref Range   Salicylate Lvl <7.0 2.8 - 30.0 mg/dL    Comment: Performed at [facility], [address] [place], [place] [address]  Acetaminophen level     Status: Abnormal   Collection Time: 12/29/18  3:25 AM  Result Value Ref Range   Acetaminophen (Tylenol), Serum <10 (L) 10 - 30 ug/mL    Comment: (NOTE) Therapeutic concentrations vary significantly. A range of 10-30 ug/mL  may be an effective concentration for many patients. However, some  are best treated at concentrations outside of this range. Acetaminophen concentrations >150 ug/mL at 4 hours after ingestion  and >50 ug/mL at 12 hours after ingestion are often associated with  toxic reactions. Performed at [facility], [address] [place], [place] [address]   cbc     Status: Abnormal   Collection Time: 12/29/18  3:25 AM  Result Value Ref Range   WBC 17.6 (H) 4.0 - 10.5 K/uL   RBC 5.62 4.22 - 5.81 MIL/uL   Hemoglobin 16.2 13.0 - 17.0 g/dL   HCT 29.5 62.1 - 30.8 %   MCV 87.0 80.0 - 100.0 fL   MCH 28.8 26.0 - 34.0 pg   MCHC 33.1 30.0 - 36.0 g/dL   RDW 65.7 84.6 - 96.2 %   Platelets 288 150 - 400 K/uL   nRBC 0.0 0.0 - 0.2 %    Comment: Performed at [facility], [address], [place], [place] [address]  Urine Drug Screen, Qualitative     Status: Abnormal   Collection Time: 12/29/18  3:25 AM  Result Value Ref Range   Tricyclic, Ur Screen NONE DETECTED NONE DETECTED   Amphetamines, Ur Screen NONE DETECTED NONE DETECTED   MDMA (Ecstasy)Ur Screen NONE DETECTED NONE DETECTED   Cocaine Metabolite,Ur [facility] NONE DETECTED NONE DETECTED   Opiate, Ur Screen NONE DETECTED NONE DETECTED   Phencyclidine (PCP) Ur S NONE DETECTED NONE DETECTED   Cannabinoid 50 Ng, Ur [place] POSITIVE (A) NONE DETECTED   Barbiturates, Ur Screen NONE DETECTED NONE DETECTED   Benzodiazepine, Ur Scrn NONE DETECTED NONE DETECTED   Methadone Scn, Ur NONE DETECTED NONE DETECTED    Comment: (NOTE) Tricyclics + metabolites, urine    Cutoff 1000 ng/mL Amphetamines + metabolites, urine  Cutoff 1000 ng/mL MDMA (Ecstasy), urine              Cutoff 500 ng/mL Cocaine Metabolite, urine          Cutoff 300 ng/mL Opiate + metabolites, urine        Cutoff 300 ng/mL Phencyclidine (PCP), urine  Cutoff 25 ng/mL Cannabinoid, urine                 Cutoff 50 ng/mL Barbiturates + metabolites, urine  Cutoff 200 ng/mL Benzodiazepine, urine              Cutoff 200 ng/mL Methadone, urine                   Cutoff 300 ng/mL The urine drug screen provides only a preliminary, unconfirmed analytical test result and should not be used for non-medical purposes. Clinical consideration and professional judgment should be applied to any positive drug  screen result due to possible interfering substances. A more specific alternate chemical method must be used in order to obtain a confirmed analytical result. Gas chromatography / mass spectrometry (GC/MS) is the preferred confirmat ory method. Performed at [facility], [address] [place], [place] [address]     Current Facility-Administered Medications  Medication Dose Route Frequency Provider Last Rate Last Dose  . hydrOXYzine (ATARAX/VISTARIL) tablet 25 mg  25 mg Oral Q6H PRN Caroline Sauger, NP       Current Outpatient Medications  Medication Sig Dispense Refill  . amoxicillin (AMOXIL) 500 MG capsule Take 1 capsule (500 mg total) by mouth 3 (three) times daily. (Patient not taking: Reported on 06/24/2018) 30 capsule 0  . FLUoxetine (PROZAC) 10 MG capsule Take 1 capsule (10 mg total) by mouth at bedtime. (Patient not taking: Reported on 06/24/2018) 15 capsule 0  . hydrOXYzine (ATARAX/VISTARIL) 25 MG tablet Take 1 tablet (25 mg total) by mouth 3 (three) times daily as needed. (Patient not taking: Reported on 06/24/2018) 30 tablet 0  . OLANZapine (ZYPREXA) 2.5 MG tablet Take 0.5 tablets (1.25 mg total) by mouth at bedtime. 15 tablet 0  . predniSONE (STERAPRED UNI-PAK 21 TAB) 10 MG (21) TBPK tablet Take 6 pills on day one then decrease by 1 pill each day (Patient not taking: Reported on 06/24/2018) 21 tablet 0    Musculoskeletal: Strength & Muscle Tone: within normal limits Gait & Station: normal Patient leans: N/A  Psychiatric Specialty Exam: Physical Exam  Nursing note and vitals reviewed. Constitutional: He is oriented to person, place, and time. He appears well-developed.  Neck: Normal range of motion. Neck supple.  Neurological: He is alert and oriented to person, place, and time.    Review of Systems  Psychiatric/Behavioral: Positive for depression and suicidal ideas.  All other systems reviewed and are negative.   Blood pressure (!) 145/80, pulse (!)  109, temperature 98.6 F (37 C), temperature source Oral, resp. rate 18, height 5\' 9"  (1.753 m), weight 62.1 kg, SpO2 99 %.Body mass index is 20.23 kg/m.  General Appearance: Casual  Eye Contact:  Good  Speech:  Clear and Coherent  Volume:  Decreased  Mood:  Anxious, Depressed, Hopeless and Worthless  Affect:  Congruent  Thought Process:  Coherent  Orientation:  Full (Time, Place, and Person)  Thought Content:  Logical and Rumination  Suicidal Thoughts:  Yes.  with intent/plan  Homicidal Thoughts:  No  Memory:  Immediate;   Good Recent;   Good Remote;   Good  Judgement:  Fair  Insight:  Fair  Psychomotor Activity:  Normal  Concentration:  Concentration: Fair  Recall:  [place] of Knowledge:  Fair  Language:  Good  Akathisia:  Negative  Handed:  Right  AIMS (if indicated):     Assets:  Communication Skills Desire for Improvement Leisure Time Resilience Social Support  ADL's:  Intact  Cognition:  WNL  Sleep:   Insomnia     Treatment Plan Summary: Medication management and Plan The patient meets criteria for psychiatric inpatient admission.  Disposition: Recommend psychiatric Inpatient admission when medically cleared. Supportive therapy provided about ongoing stressors.  Gillermo Murdoch, NP 12/29/2018 5:07 AM

## 2018-12-29 NOTE — ED Triage Notes (Addendum)
Patient ambulatory to triage with steady gait, without difficulty or distress noted, brought in by [facility] PD voluntarily for SI with hx of same; pt with superficial cuts to left upper arm

## 2018-12-29 NOTE — BH Assessment (Addendum)
Patient has been accepted to [facility]  Patient assigned to [facility] Accepting physician is Dr. Patrick Jupiter.  Call report to [phone]  Representative was Clarene Critchley.   ER Staff is aware of it:  Nitchia, ER Secretary  Dr. Charna Archer, ER MD  Amy T., Patient's Nurse      Accepting facility requested for IVC paperwork to be faxed to [phone]     Address: [address], [place] [address] [phone]

## 2018-12-29 NOTE — BH Assessment (Signed)
Assessment Note  Jason Wise is an 23 y.o. male. Jason Wise arrived to the ED by way of [facility] department.  He reports, "I was having suicidal thoughts".  He denied currently having a plan.  He states, "I was sitting by myself thinking, that is what triggered". He reports that this has happened before.  He shared that it happens when he is in a room by himself thinking.  He reports that he is depressed.  He has been feeling depression for approximately 1 year, since his father's death. He shared that he gets upset and angry, I go off on people, and I cut myself.  He reports symptoms of anxiety.  He states that being around a lot of people make him anxious. He states that he gets panic attack. He has difficulty breathing and his chest tightens and he get "dizzyheaded". He denied having auditory or visual hallucinations.   He denied having homicidal ideation or intent. He reports that he does not use drugs, and uses alcohol responsibly.  He reports that he is under stress due to his arrest twice in the past 2 months. He shared that he has a "Class C Felonoy".  Diagnosis: Depression, Anxiety  Past Medical History:  Past Medical History:  Diagnosis Date  . Bipolar disorder ([place])   . Panic attack     History reviewed. No pertinent surgical history.  Family History: No family history on file.  Social History:  reports that he has been smoking cigarettes. He has been smoking about 1.00 pack per day. He has never used smokeless tobacco. He reports that he does not drink alcohol or use drugs.  Additional Social History:  Alcohol / Drug Use History of alcohol / drug use?: No history of alcohol / drug abuse  CIWA: CIWA-Ar BP: (!) 145/80 Pulse Rate: (!) 109 COWS:    Allergies: No Known Allergies  Home Medications: (Not in a hospital admission)   OB/GYN Status:  No LMP for male patient.  General Assessment Data Location of Assessment: [facility] ED TTS Assessment: In system Is this a Tele or  Face-to-Face Assessment?: Face-to-Face Is this an Initial Assessment or a Re-assessment for this encounter?: Initial Assessment Patient Accompanied by:: N/A Language Other than English: No Living Arrangements: Other (Comment)(Private residence) What gender do you identify as?: Male Marital status: Single Living Arrangements: Non-relatives/Friends Can pt return to current living arrangement?: Yes Admission Status: Voluntary Is patient capable of signing voluntary admission?: Yes Referral Source: Self/Family/Friend Insurance type: None  Medical Screening Exam ([place]) Medical Exam completed: Yes  Crisis Care Plan Living Arrangements: Non-relatives/Friends Legal Guardian: Other:(Self) Name of Psychiatrist: None Name of Therapist: None  Education Status Is patient currently in school?: No Is the patient employed, unemployed or receiving disability?: Unemployed  Risk to self with the past 6 months Suicidal Ideation: Yes-Currently Present Has patient been a risk to self within the past 6 months prior to admission? : No Suicidal Intent: No-Not Currently/Within Last 6 Months Has patient had any suicidal intent within the past 6 months prior to admission? : No Is patient at risk for suicide?: Yes Suicidal Plan?: Yes-Currently Present Has patient had any suicidal plan within the past 6 months prior to admission? : Yes Specify Current Suicidal Plan: Cut himself in a main artery Access to Means: No What has been your use of drugs/alcohol within the last 12 months?: Occassional Alcohol usage Previous Attempts/Gestures: No How many times?: 0 Other Self Harm Risks: Denied Triggers for Past  Attempts: Unknown Intentional Self Injurious Behavior: None Family Suicide History: No Recent stressful life event(s): Legal Issues, Financial Problems Persecutory voices/beliefs?: No Depression: Yes Depression Symptoms: Despondent Substance abuse history and/or treatment for substance  abuse?: No Suicide prevention information given to non-admitted patients: Not applicable  Risk to Others within the past 6 months Homicidal Ideation: No Does patient have any lifetime risk of violence toward others beyond the six months prior to admission? : No Thoughts of Harm to Others: No Current Homicidal Intent: No Current Homicidal Plan: No Access to Homicidal Means: No Identified Victim: None identified History of harm to others?: No Assessment of Violence: None Noted Does patient have access to weapons?: No Criminal Charges Pending?: Yes Describe Pending Criminal Charges: Pleas [name], exploitations of minor Does patient have a court date: Yes Court Date: 02/15/19 Is patient on probation?: No  Psychosis Hallucinations: None noted Delusions: None noted  Mental Status Report Appearance/Hygiene: In scrubs Eye Contact: Fair Motor Activity: Unremarkable Speech: Logical/coherent Level of Consciousness: Alert Mood: Anxious Affect: Irritable Anxiety Level: Minimal Thought Processes: Coherent Judgement: Partial Orientation: Appropriate for developmental age Obsessive Compulsive Thoughts/Behaviors: None  Cognitive Functioning Concentration: Fair Memory: Recent Intact Is patient IDD: No Insight: Poor Impulse Control: Fair Appetite: Good Have you had any weight changes? : No Change Sleep: Decreased Vegetative Symptoms: None  ADLScreening [facility] Assessment Services) Patient's cognitive ability adequate to safely complete daily activities?: Yes Patient able to express need for assistance with ADLs?: Yes Independently performs ADLs?: Yes (appropriate for developmental age)  Prior Inpatient Therapy Prior Inpatient Therapy: No  Prior Outpatient Therapy Prior Outpatient Therapy: No Does patient have an ACCT team?: No Does patient have Intensive In-House Services?  : No Does patient have [facility] services? : No Does patient have P4CC services?:  No  ADL Screening (condition at time of admission) Patient's cognitive ability adequate to safely complete daily activities?: Yes Is the patient deaf or have difficulty hearing?: No Does the patient have difficulty seeing, even when wearing glasses/contacts?: No Does the patient have difficulty concentrating, remembering, or making decisions?: No Patient able to express need for assistance with ADLs?: Yes Does the patient have difficulty dressing or bathing?: No Independently performs ADLs?: Yes (appropriate for developmental age) Does the patient have difficulty walking or climbing stairs?: No Weakness of Legs: None Weakness of Arms/Hands: None  Home Assistive Devices/Equipment Home Assistive Devices/Equipment: None    Abuse/Neglect Assessment (Assessment to be complete while patient is alone) Abuse/Neglect Assessment Can Be Completed: (Denies a history of abuse.)     Advance Directives (For Healthcare) Does Patient Have a Medical Advance Directive?: No Would patient like information on creating a medical advance directive?: No - Patient declined          Disposition:  Disposition Initial Assessment Completed for this Encounter: Yes  On Site Evaluation by:   Reviewed with Physician:    Justice Deeds 12/29/2018 5:24 AM

## 2018-12-29 NOTE — ED Provider Notes (Signed)
[facility] Emergency Department Provider Note  ____________________________________________   First MD Initiated Contact with Patient 12/29/18 [phone]     (approximate)  I have reviewed the triage vital signs and the nursing notes.   HISTORY  Chief Complaint Suicidal ideation   HPI Jason Wise is a [age] y.o. male who reports a history of bipolar disorder and panic attacks who presents voluntarily accompanied by law enforcement for evaluation of depression and suicidal ideation.  He says that he has been depressed for a long time, months and months, but just recently over the last 24 hours ago it became so severe that he was having thoughts of killing himself.  When asked about specific methods he discussed, he said "a lot of them".   He knew he needed help so he called 911.  He denies alcohol use and says that he smokes marijuana but no other drugs.  He denies any contact with COVID-19 patients.  He denies fever/chills, sore throat, chest pain, shortness of breath, cough, nausea, vomiting, abdominal pain, and dysuria.  Nothing particular makes his symptoms better or worse.  He says he does not have a psychiatrist and does not take any psychiatric medications at this time (although multiple psychiatric medications are listed on his medical history).  Of note, the patient has a number of superficial scratches on his left upper arm that he said he did to try to feel something.        Past Medical History:  Diagnosis Date  . Bipolar disorder (HCC)   . Panic attack     Patient Active Problem List   Diagnosis Date Noted  . Suicidal ideation 12/29/2018  . Substance induced mood disorder (HCC) 05/08/2016  . Bipolar depression (HCC) 05/08/2016    History reviewed. No pertinent surgical history.  Prior to Admission medications   Medication Sig Start Date End Date Taking? Authorizing Provider  amoxicillin (AMOXIL) 500 MG capsule Take 1 capsule (500 mg total) by  mouth 3 (three) times daily. Patient not taking: Reported on 06/24/2018 05/12/18   Faythe GheeFisher, Susan W, PA-C  FLUoxetine (PROZAC) 10 MG capsule Take 1 capsule (10 mg total) by mouth at bedtime. Patient not taking: Reported on 06/24/2018 05/04/16   Myrna BlazerSchaevitz, David Matthew, MD  hydrOXYzine (ATARAX/VISTARIL) 25 MG tablet Take 1 tablet (25 mg total) by mouth 3 (three) times daily as needed. Patient not taking: Reported on 06/24/2018 09/09/16   Enid DerryWagner, Ashley, PA-C  OLANZapine (ZYPREXA) 2.5 MG tablet Take 0.5 tablets (1.25 mg total) by mouth at bedtime. 05/04/16 05/04/17  Myrna BlazerSchaevitz, David Matthew, MD  predniSONE (STERAPRED UNI-PAK 21 TAB) 10 MG (21) TBPK tablet Take 6 pills on day one then decrease by 1 pill each day Patient not taking: Reported on 06/24/2018 05/12/18   Faythe GheeFisher, Susan W, PA-C    Allergies Patient has no known allergies.  No family history on file.  Social History Social History   Tobacco Use  . Smoking status: Current Every Day Smoker    Packs/day: 1.00    Types: Cigarettes  . Smokeless tobacco: Never Used  Substance Use Topics  . Alcohol use: No  . Drug use: No    Review of Systems Constitutional: No fever/chills Eyes: No visual changes. ENT: No sore throat. Cardiovascular: Denies chest pain. Respiratory: Denies shortness of breath. Gastrointestinal: No abdominal pain.  No nausea, no vomiting.  No diarrhea.  No constipation. Genitourinary: Negative for dysuria. Musculoskeletal: Negative for neck pain.  Negative for back pain. Integumentary: Self-inflicted superficial  lacerations to the left upper arm Neurological: Negative for headaches, focal weakness or numbness. Psychiatric:  Depression, SI.  ____________________________________________   PHYSICAL EXAM:  VITAL SIGNS: ED Triage Vitals  Enc Vitals Group     BP 12/29/18 0323 (!) 145/80     Pulse Rate 12/29/18 0323 (!) 109     Resp 12/29/18 0323 18     Temp 12/29/18 0323 98.6 F (37 C)     Temp Source 12/29/18 0323  Oral     SpO2 12/29/18 0323 99 %     Weight 12/29/18 0322 62.1 kg (137 lb)     Height 12/29/18 0322 1.753 m (5\' 9" )     Head Circumference --      Peak Flow --      Pain Score 12/29/18 0322 0     Pain Loc --      Pain Edu? --      Excl. in GC? --     Constitutional: Alert and oriented.  Calm, no acute distress. Eyes: Conjunctivae are normal.  Head: Atraumatic. Nose: No congestion/rhinnorhea. Mouth/Throat: Mucous membranes are moist. Neck: No stridor.  No meningeal signs.   Cardiovascular: Normal rate, regular rhythm. Good peripheral circulation. Grossly normal heart sounds. Respiratory: Normal respiratory effort.  No retractions. Gastrointestinal: Soft and nontender. No distention.  Musculoskeletal: No lower extremity tenderness nor edema. No gross deformities of extremities. Neurologic:  Normal speech and language. No gross focal neurologic deficits are appreciated.  Skin:  Skin is warm, dry and intact except for numerous superficial scratches on the left upper arm in a "crisscross" pattern.  No evidence of infection. Psychiatric: Mood and affect are normal, quiet, calm and cooperative, but endorses suicidal ideation and depression as described above.  ____________________________________________   LABS (all labs ordered are listed, but only abnormal results are displayed)  Labs Reviewed  COMPREHENSIVE METABOLIC PANEL - Abnormal; Notable for the following components:      Result Value   Glucose, Bld 119 (*)    All other components within normal limits  ACETAMINOPHEN LEVEL - Abnormal; Notable for the following components:   Acetaminophen (Tylenol), Serum <10 (*)    All other components within normal limits  CBC - Abnormal; Notable for the following components:   WBC 17.6 (*)    All other components within normal limits  URINE DRUG SCREEN, QUALITATIVE ([facility] ONLY) - Abnormal; Notable for the following components:   Cannabinoid 50 Ng, Ur [place] POSITIVE (*)    All other  components within normal limits  SARS CORONAVIRUS 2 BY RT PCR (HOSPITAL ORDER, PERFORMED IN [facility])  ETHANOL  SALICYLATE LEVEL   ____________________________________________  EKG  ED ECG REPORT I, 12/31/18, the attending physician, personally viewed and interpreted this ECG.  Date: 12/29/2018 EKG Time: 4: 08 Rate: 85 Rhythm: Incomplete right bundle branch block QRS Axis: normal Intervals: normal ST/T Wave abnormalities: Non-specific ST segment / T-wave changes, but no clear evidence of acute ischemia. Narrative Interpretation: no definitive evidence of acute ischemia; does not meet STEMI criteria.   ____________________________________________  RADIOLOGY I, 09, personally viewed and evaluated these images (plain radiographs) as part of my medical decision making, as well as reviewing the written report by the radiologist.  ED MD interpretation: No indication for emergent imaging  Official radiology report(s): No results found.  ____________________________________________   PROCEDURES   Procedure(s) performed (including Critical Care):  Procedures   ____________________________________________   INITIAL IMPRESSION / MDM / ASSESSMENT AND PLAN / ED COURSE  As part of my medical decision making, I reviewed the following data within the [place] notes reviewed and incorporated, Labs reviewed , Old chart reviewed, A consult was requested and obtained from this/these consultant(s) Psychiatry and Notes from prior ED visits   Differential diagnosis includes, but is not limited to, substance-induced mood disorder, bipolar disorder, depression, adjustment disorder.  The patient is calm and cooperative but is endorsing gradually worsening depression that has become severe and causing her to have thoughts of killing himself.  He is here voluntarily and wants help.  I will not put him under involuntary commitment at this  time.  I have ordered a tetanus vaccination but he does not require any sutures nor antibiotics.  Lab work is pending other than an normal comprehensive metabolic panel and a CBC that is normal except for a mild leukocytosis of 17.6 which is of unclear significance but likely stress related and an otherwise healthy young male.      Clinical Course as of Dec 28 636  Tue Dec 29, 2018  0451 Discussed case in person with Kennyth Lose with psychiatry.  She plans to admit him for his depression and adjustment disorder.  I have ordered COVID-19 swab.   [CF]  9675 SARS Coronavirus 2: NEGATIVE [CF]    Clinical Course User Index [CF] Hinda Kehr, MD     ____________________________________________  FINAL CLINICAL IMPRESSION(S) / ED DIAGNOSES  Final diagnoses:  Depression, unspecified depression type  Adjustment disorder, unspecified type     MEDICATIONS GIVEN DURING THIS VISIT:  Medications  hydrOXYzine (ATARAX/VISTARIL) tablet 25 mg (has no administration in time range)  Tdap (BOOSTRIX) injection 0.5 mL (0.5 mLs Intramuscular Given 12/29/18 0359)     ED Discharge Orders    None      *Please note:  Jason Wise was evaluated in Emergency Department on 12/29/2018 for the symptoms described in the history of present illness. He was evaluated in the context of the global COVID-19 pandemic, which necessitated consideration that the patient might be at risk for infection with the SARS-CoV-2 virus that causes COVID-19. Institutional protocols and algorithms that pertain to the evaluation of patients at risk for COVID-19 are in a state of rapid change based on information released by regulatory bodies including the CDC and federal and state organizations. These policies and algorithms were followed during the patient's care in the ED.  Some ED evaluations and interventions may be delayed as a result of limited staffing during the pandemic.*  Note:  This document was prepared using Dragon voice  recognition software and may include unintentional dictation errors.   Hinda Kehr, MD 12/29/18 [phone]

## 2018-12-29 NOTE — ED Notes (Signed)
Pt. Transferred from Triage to room 22 after dressing out and screening for contraband. Report to include Situation, Background, Assessment and Recommendations from RN. Pt. Oriented to Quad including Q15 minute rounds as well as Engineer, drilling for their protection. Patient is alert and oriented, warm and dry in no acute distress. Patient reported SI and denied HI, and AVH. Pt. Encouraged to let me know if needs arise.

## 2019-05-07 ENCOUNTER — Emergency Department
Admission: EM | Admit: 2019-05-07 | Discharge: 2019-05-07 | Disposition: A | Discharge status: Home / Self Care | Payer: Self-pay | Attending: Emergency Medicine | Admitting: Emergency Medicine

## 2019-05-07 ENCOUNTER — Other Ambulatory Visit: Payer: Self-pay

## 2019-05-07 DIAGNOSIS — K047 Periapical abscess without sinus: Secondary | ICD-10-CM | POA: Insufficient documentation

## 2019-05-07 DIAGNOSIS — F17210 Nicotine dependence, cigarettes, uncomplicated: Secondary | ICD-10-CM | POA: Insufficient documentation

## 2019-05-07 DIAGNOSIS — K029 Dental caries, unspecified: Secondary | ICD-10-CM | POA: Insufficient documentation

## 2019-05-07 MED ORDER — AMOXICILLIN-POT CLAVULANATE 875-125 MG PO TABS
1.0000 | ORAL_TABLET | Freq: Once | ORAL | Status: AC
  Administered 2019-05-07: 1 via ORAL
  Filled 2019-05-07: qty 1

## 2019-05-07 MED ORDER — TRAMADOL HCL 50 MG PO TABS
50.0000 mg | ORAL_TABLET | Freq: Once | ORAL | Status: AC
  Administered 2019-05-07: 50 mg via ORAL
  Filled 2019-05-07: qty 1

## 2019-05-07 MED ORDER — AMOXICILLIN-POT CLAVULANATE 875-125 MG PO TABS: 1.0000 | ORAL_TABLET | Freq: Two times a day (BID) | ORAL | 0 refills | Status: AC

## 2019-05-07 MED ORDER — OXYCODONE-ACETAMINOPHEN 5-325 MG PO TABS: 1.0000 | ORAL_TABLET | Freq: Three times a day (TID) | ORAL | 0 refills | Status: AC | PRN

## 2019-05-07 MED ORDER — LIDOCAINE VISCOUS HCL 2 % MT SOLN
15.0000 mL | Freq: Once | OROMUCOSAL | Status: AC
  Administered 2019-05-07: 03:00:00 15 mL via OROMUCOSAL
  Filled 2019-05-07: qty 15

## 2019-05-07 NOTE — ED Provider Notes (Signed)
[facility] Emergency Department Provider Note  ____________________________________________   First MD Initiated Contact with Patient 05/07/19 0240     (approximate)  I have reviewed the triage vital signs and the nursing notes.   HISTORY  Chief Complaint Oral Swelling   HPI Jason Wise is a 24 y.o. male with below list of previous medical conditions presents to the emergency department with left mandible molar dental caries with associated swelling and pain with onset yesterday.  Patient denies any fever no difficulty swallowing.        Past Medical History:  Diagnosis Date  . Bipolar disorder ([place])   . Panic attack     Patient Active Problem List   Diagnosis Date Noted  . Suicidal ideation 12/29/2018  . Substance induced mood disorder ([place]) 05/08/2016  . Bipolar depression ([place]) 05/08/2016    No past surgical history on file.  Prior to Admission medications   Medication Sig Start Date End Date Taking? Authorizing Provider  amoxicillin (AMOXIL) 500 MG capsule Take 1 capsule (500 mg total) by mouth 3 (three) times daily. Patient not taking: Reported on 06/24/2018 05/12/18   Versie Starks, PA-C  FLUoxetine (PROZAC) 10 MG capsule Take 1 capsule (10 mg total) by mouth at bedtime. Patient not taking: Reported on 06/24/2018 05/04/16   Orbie Pyo, MD  hydrOXYzine (ATARAX/VISTARIL) 25 MG tablet Take 1 tablet (25 mg total) by mouth 3 (three) times daily as needed. Patient not taking: Reported on 06/24/2018 09/09/16   Laban Emperor, PA-C  OLANZapine (ZYPREXA) 2.5 MG tablet Take 0.5 tablets (1.25 mg total) by mouth at bedtime. 05/04/16 05/04/17  Orbie Pyo, MD  predniSONE (STERAPRED UNI-PAK 21 TAB) 10 MG (21) TBPK tablet Take 6 pills on day one then decrease by 1 pill each day Patient not taking: Reported on 06/24/2018 05/12/18   Versie Starks, PA-C    Allergies Patient has no known allergies.  No family history on  file.  Social History Social History   Tobacco Use  . Smoking status: Current Every Day Smoker    Packs/day: 1.00    Types: Cigarettes  . Smokeless tobacco: Never Used  Substance Use Topics  . Alcohol use: No  . Drug use: No    Review of Systems Constitutional: No fever/chills Eyes: No visual changes. ENT: No sore throat.  Positive for left jaw pain and swelling with dental caries  cardiovascular: Denies chest pain. Respiratory: Denies shortness of breath. Gastrointestinal: No abdominal pain.  No nausea, no vomiting.  No diarrhea.  No constipation. Genitourinary: Negative for dysuria. Musculoskeletal: Negative for neck pain.  Negative for back pain. Integumentary: Negative for rash. Neurological: Negative for headaches, focal weakness or numbness.   ____________________________________________   PHYSICAL EXAM:  VITAL SIGNS: ED Triage Vitals [05/07/19 0146]  Enc Vitals Group     BP (!) 142/88     Pulse Rate (!) 102     Resp 20     Temp 98.8 F (37.1 C)     Temp Source Oral     SpO2 100 %     Weight 59.9 kg (132 lb)     Height 1.753 m (5\' 9" )     Head Circumference      Peak Flow      Pain Score 8     Pain Loc      Pain Edu?      Excl. in [place]?     Constitutional: Alert and oriented.  Eyes: Conjunctivae are  normal.  Mouth/Throat: Appreciable swelling of the left mandible.  Multiple dental caries noted with suspicion for left second molar dental abscess.  Multiple other dental caries noted Neck: No stridor.  No meningeal signs.   Cardiovascular: Normal rate, regular rhythm. Good peripheral circulation. Grossly normal heart sounds. Respiratory: Normal respiratory effort.  No retractions. Gastrointestinal: Soft and nontender. No distention.  Neurologic:  Normal speech and language. No gross focal neurologic deficits are appreciated.  Skin:  Skin is warm, dry and intact. Psychiatric: Mood and affect are normal. Speech and behavior are  normal.   Procedures   ____________________________________________   INITIAL IMPRESSION / MDM / ASSESSMENT AND PLAN / ED COURSE  As part of my medical decision making, I reviewed the following data within the electronic MEDICAL RECORD NUMBER  24 year old male presented with multiple dental caries with associated pain and swelling concern for dental abscess.  Patient given Augmentin and viscous lidocaine tramadol in the emergency department will be prescribed the same for home  ____________________________________________  FINAL CLINICAL IMPRESSION(S) / ED DIAGNOSES  Final diagnoses:  Dental caries  Dental abscess     MEDICATIONS GIVEN DURING THIS VISIT:  Medications - No data to display   ED Discharge Orders    None      *Please note:  Jason Wise was evaluated in Emergency Department on 05/07/2019 for the symptoms described in the history of present illness. He was evaluated in the context of the global COVID-19 pandemic, which necessitated consideration that the patient might be at risk for infection with the SARS-CoV-2 virus that causes COVID-19. Institutional protocols and algorithms that pertain to the evaluation of patients at risk for COVID-19 are in a state of rapid change based on information released by regulatory bodies including the CDC and federal and state organizations. These policies and algorithms were followed during the patient's care in the ED.  Some ED evaluations and interventions may be delayed as a result of limited staffing during the pandemic.*  Note:  This document was prepared using Dragon voice recognition software and may include unintentional dictation errors.   Darci Current, MD 05/07/19 [phone]

## 2019-05-07 NOTE — ED Triage Notes (Signed)
Pt in with co left jaw swelling since yesterday, states he has caries but has not seen dentist.

## 2019-09-21 ENCOUNTER — Other Ambulatory Visit: Payer: Self-pay

## 2019-09-21 ENCOUNTER — Encounter: Payer: Self-pay | Admitting: Emergency Medicine

## 2019-09-21 ENCOUNTER — Emergency Department: Admission: EM | Admit: 2019-09-21 | Discharge: 2019-09-21 | Discharge status: Against Medical Advice | Payer: Self-pay

## 2019-09-21 NOTE — ED Triage Notes (Signed)
Patient ambulatory to triage with steady gait, without difficulty or distress noted; pt reports abscess to left jawline for "months"

## 2022-07-02 ENCOUNTER — Emergency Department
Admission: EM | Admit: 2022-07-02 | Discharge: 2022-07-02 | Disposition: A | Discharge status: Home / Self Care | Payer: Medicaid Other | Attending: Emergency Medicine | Admitting: Emergency Medicine

## 2022-07-02 DIAGNOSIS — M25561 Pain in right knee: Secondary | ICD-10-CM | POA: Insufficient documentation

## 2022-07-02 DIAGNOSIS — G8929 Other chronic pain: Secondary | ICD-10-CM | POA: Diagnosis not present

## 2022-07-02 DIAGNOSIS — H73891 Other specified disorders of tympanic membrane, right ear: Secondary | ICD-10-CM | POA: Diagnosis not present

## 2022-07-02 DIAGNOSIS — H9201 Otalgia, right ear: Secondary | ICD-10-CM | POA: Diagnosis not present

## 2022-07-02 DIAGNOSIS — H6591 Unspecified nonsuppurative otitis media, right ear: Secondary | ICD-10-CM

## 2022-07-02 MED ORDER — MELOXICAM 15 MG PO TABS: 15.0000 mg | ORAL_TABLET | Freq: Every day | ORAL | 1 refills | Status: AC

## 2022-07-02 NOTE — Discharge Instructions (Addendum)
Take one spray of Flonase each side.  Take Meloxicam once daily for pain and inflammation.

## 2022-07-02 NOTE — ED Provider Notes (Signed)
[facility] Provider Note  Patient Contact: 9:59 PM (approximate)   History   Otalgia and Knee Pain   HPI  Jason Wise is a 27 y.o. male presents to the emergency department with chronic right-sided knee pain for the past 2 years without recent trauma.  Right-sided ear pain for the past 2 days.  Patient has been able to bear weight easily.  He has had no discharge from the right ear or fever.  No prodrome of viral URI-like illness.      Physical Exam   Triage Vital Signs: ED Triage Vitals [07/02/22 1725]  Enc Vitals Group     BP (!) 142/76     Pulse Rate 97     Resp 16     Temp 97.6 F (36.4 C)     Temp Source Oral     SpO2 100 %     Weight 144 lb (65.3 kg)     Height  (1.753 m)     Head Circumference      Peak Flow      Pain Score 6     Pain Loc      Pain Edu?      Excl. in GC?     Most recent vital signs: Vitals:   07/02/22 1725 07/02/22 2050  BP: (!) 142/76   Pulse: 97 90  Resp: 16 16  Temp: 97.6 F (36.4 C)   SpO2: 100% 99%     General: Alert and in no acute distress. Eyes:  PERRL. EOMI. Head: No acute traumatic findings ENT: Ears: Patient has middle ear effusion on the right.       Nose: No congestion/rhinnorhea.      Mouth/Throat: Mucous membranes are moist. Neck: No stridor. No cervical spine tenderness to palpation. Cardiovascular:  Good peripheral perfusion Respiratory: Normal respiratory effort without tachypnea or retractions. Lungs CTAB. Good air entry to the bases with no decreased or absent breath sounds. Gastrointestinal: Bowel sounds 4 quadrants. Soft and nontender to palpation. No guarding or rigidity. No palpable masses. No distention. No CVA tenderness. Musculoskeletal: Full range of motion to all extremities.  Neurologic:  No gross focal neurologic deficits are appreciated.  Skin:   No rash noted Other:   ED Results / Procedures / Treatments   Labs (all labs ordered are listed, but only  abnormal results are displayed) Labs Reviewed - No data to display     PROCEDURES:  Critical Care performed: No  Procedures   MEDICATIONS ORDERED IN ED: Medications - No data to display   IMPRESSION / MDM / ASSESSMENT AND PLAN / ED COURSE  I reviewed the triage vital signs and the nursing notes.                              Assessment and plan:  Middle ear effusion Chronic right knee pain 27 year old male presents to the emergency department with chronic right knee pain for the past 2 years and physical exam findings concerning for middle ear effusion.  Recommended daily Flonase and meloxicam once daily for pain and inflammation.     FINAL CLINICAL IMPRESSION(S) / ED DIAGNOSES   Final diagnoses:  Fluid level behind tympanic membrane of right ear  Chronic pain of right knee     Rx / DC Orders   ED Discharge Orders          Ordered    meloxicam (MOBIC) 15 MG  tablet  Daily        07/02/22 2045             Note:  This document was prepared using Dragon voice recognition software and may include unintentional dictation errors.   Gasper Lloyd 07/02/22 2203    Chesley Noon, MD 07/02/22 [phone]

## 2022-07-02 NOTE — ED Notes (Signed)
PT brought to ed rm 50 at this time, this RN now assuming care.  

## 2022-07-02 NOTE — ED Triage Notes (Signed)
Pt reports rt knee pain x 2 years and rt ear pain x 2 days, unable to hear from ear.

## 2022-07-10 DIAGNOSIS — Z419 Encounter for procedure for purposes other than remedying health state, unspecified: Secondary | ICD-10-CM | POA: Diagnosis not present

## 2022-07-31 DIAGNOSIS — F4310 Post-traumatic stress disorder, unspecified: Secondary | ICD-10-CM | POA: Diagnosis not present

## 2022-08-10 DIAGNOSIS — Z419 Encounter for procedure for purposes other than remedying health state, unspecified: Secondary | ICD-10-CM | POA: Diagnosis not present

## 2022-08-22 DIAGNOSIS — S70362A Insect bite (nonvenomous), left thigh, initial encounter: Secondary | ICD-10-CM | POA: Diagnosis not present

## 2022-08-22 DIAGNOSIS — S70361A Insect bite (nonvenomous), right thigh, initial encounter: Secondary | ICD-10-CM | POA: Diagnosis not present

## 2022-08-22 DIAGNOSIS — W57XXXA Bitten or stung by nonvenomous insect and other nonvenomous arthropods, initial encounter: Secondary | ICD-10-CM | POA: Diagnosis not present

## 2022-09-09 DIAGNOSIS — Z419 Encounter for procedure for purposes other than remedying health state, unspecified: Secondary | ICD-10-CM | POA: Diagnosis not present

## 2022-09-20 DIAGNOSIS — R0781 Pleurodynia: Secondary | ICD-10-CM | POA: Diagnosis not present

## 2022-09-20 DIAGNOSIS — J984 Other disorders of lung: Secondary | ICD-10-CM | POA: Diagnosis not present

## 2022-09-20 DIAGNOSIS — R0789 Other chest pain: Secondary | ICD-10-CM | POA: Diagnosis not present

## 2022-10-10 DIAGNOSIS — Z419 Encounter for procedure for purposes other than remedying health state, unspecified: Secondary | ICD-10-CM | POA: Diagnosis not present

## 2022-11-10 DIAGNOSIS — Z419 Encounter for procedure for purposes other than remedying health state, unspecified: Secondary | ICD-10-CM | POA: Diagnosis not present

## 2022-11-16 ENCOUNTER — Emergency Department: Payer: BLUE CROSS/BLUE SHIELD

## 2022-11-16 ENCOUNTER — Other Ambulatory Visit: Payer: Self-pay

## 2022-11-16 ENCOUNTER — Emergency Department
Admission: EM | Admit: 2022-11-16 | Discharge: 2022-11-16 | Disposition: A | Discharge status: Home / Self Care | Payer: BLUE CROSS/BLUE SHIELD | Attending: Emergency Medicine | Admitting: Emergency Medicine

## 2022-11-16 DIAGNOSIS — S86912A Strain of unspecified muscle(s) and tendon(s) at lower leg level, left leg, initial encounter: Secondary | ICD-10-CM | POA: Diagnosis not present

## 2022-11-16 DIAGNOSIS — S86212A Strain of muscle(s) and tendon(s) of anterior muscle group at lower leg level, left leg, initial encounter: Secondary | ICD-10-CM | POA: Diagnosis not present

## 2022-11-16 DIAGNOSIS — M79662 Pain in left lower leg: Secondary | ICD-10-CM | POA: Diagnosis not present

## 2022-11-16 DIAGNOSIS — M79605 Pain in left leg: Secondary | ICD-10-CM

## 2022-11-16 DIAGNOSIS — M7989 Other specified soft tissue disorders: Secondary | ICD-10-CM | POA: Diagnosis not present

## 2022-11-16 DIAGNOSIS — R9431 Abnormal electrocardiogram [ECG] [EKG]: Secondary | ICD-10-CM | POA: Diagnosis not present

## 2022-11-16 DIAGNOSIS — S86812A Strain of other muscle(s) and tendon(s) at lower leg level, left leg, initial encounter: Secondary | ICD-10-CM

## 2022-11-16 DIAGNOSIS — X509XXA Other and unspecified overexertion or strenuous movements or postures, initial encounter: Secondary | ICD-10-CM | POA: Diagnosis not present

## 2022-11-16 NOTE — ED Provider Notes (Signed)
[facility] Provider Note   Event Date/Time   First MD Initiated Contact with Patient 11/16/22 1529     (approximate) History  Leg Injury  HPI Jason Wise is a 27 y.o. male with past medical history of bipolar depression who presents for left lower leg pain along the distal anterior lower leg.  Patient states that he was reaching for a drink while he was sitting in a booth and felt a pop at the distal end of the anterior lower leg.  Patient states that he has been having pain overlying this area especially with trying to push down with the left foot.  Patient states that he has full range of motion but with pain. ROS: Patient currently denies any vision changes, tinnitus, difficulty speaking, facial droop, sore throat, chest pain, shortness of breath, abdominal pain, nausea/vomiting/diarrhea, dysuria, or weakness/numbness/paresthesias in any extremity   Physical Exam  Triage Vital Signs: ED Triage Vitals [11/16/22 1431]  Encounter Vitals Group     BP 138/76     Systolic BP Percentile      Diastolic BP Percentile      Pulse Rate (!) 106     Resp 18     Temp 98.2 F (36.8 C)     Temp Source Oral     SpO2 95 %     Weight      Height      Head Circumference      Peak Flow      Pain Score 7     Pain Loc      Pain Education      Exclude from Growth Chart    Most recent vital signs: Vitals:   11/16/22 1431  BP: 138/76  Pulse: (!) 106  Resp: 18  Temp: 98.2 F (36.8 C)  SpO2: 95%   General: Awake, oriented x4. CV:  Good peripheral perfusion.  Resp:  Normal effort.  Abd:  No distention.  Other:  Young adult well-developed, well-nourished Caucasian male resting comfortably in no acute distress.  Bedside ultrasonography of the left anterior lower leg shows significant edema in the tendinous/muscular bundle in the area of the superior extensor retinaculum consistent with likely sprain/mild strain ED Results / Procedures / Treatments  Labs (all labs  ordered are listed, but only abnormal results are displayed) Labs Reviewed - No data to display RADIOLOGY ED MD interpretation: X-ray of the left tibia and fibula independently interpreted by me and shows no acute fracture or dislocation -Agree with radiology assessment Official radiology report(s): DG Tibia/Fibula Left  Result Date: 11/16/2022 CLINICAL DATA:  pain EXAM: LEFT TIBIA AND FIBULA - 2 VIEW COMPARISON:  None Available. FINDINGS: No acute fracture or dislocation. Joint spaces and alignment are maintained. No area of erosion or osseous destruction. No unexpected radiopaque foreign body. Mild soft tissue edema. IMPRESSION: No acute fracture or dislocation. Electronically Signed   By: Meda Klinefelter M.D.   On: 11/16/2022 15:35   PROCEDURES: Critical Care performed: No .1-3 Lead EKG Interpretation  Performed by: Merwyn Katos, MD Authorized by: Merwyn Katos, MD     Interpretation: normal     ECG rate:  71   ECG rate assessment: normal     Rhythm: sinus rhythm     Ectopy: none     Conduction: normal    MEDICATIONS ORDERED IN ED: Medications - No data to display IMPRESSION / MDM / ASSESSMENT AND PLAN / ED COURSE  I reviewed the triage vital signs  and the nursing notes.                             The patient is on the cardiac monitor to evaluate for evidence of arrhythmia and/or significant heart rate changes. Patient's presentation is most consistent with acute presentation with potential threat to life or bodily function. 27 year old male with no stated past medical history presents for left anterior lower leg pain after feeling a pop while bending over Given history, exam and workup I have low suspicion for fracture, dislocation, septic arthritis, gout flare, new autoimmune arthropathy, or gonococcal arthropathy.  Interventions: X-ray of the left lower extremity shows no evidence of acute abnormalities Bedside ultrasonography does show edema in the left anterior  lower leg consistent with likely sprain/strain especially considering patient has full range of motion.  I have low suspicion for complete tear of any anterior lower leg musculature/tendons.  There is no laxity of the joints and therefore I am not concerned for ligamentous injury. Disposition: Discharge home with strict return precautions and instructions for prompt primary care follow up in the next week.   FINAL CLINICAL IMPRESSION(S) / ED DIAGNOSES   Final diagnoses:  Anterior leg pain, left  Strain of left tibialis anterior muscle, initial encounter   Rx / DC Orders   ED Discharge Orders     None      Note:  This document was prepared using Dragon voice recognition software and may include unintentional dictation errors.   Merwyn Katos, MD 11/16/22 [phone]

## 2022-11-16 NOTE — Discharge Instructions (Signed)
Please use ibuprofen (Motrin) up to 800 mg every 8 hours, naproxen (Naprosyn) up to 500 mg every 12 hours, and/or acetaminophen (Tylenol) up to 4 g/day for any continued pain.  Please do not use this medication regimen for longer than 7 days

## 2022-11-16 NOTE — ED Triage Notes (Signed)
Pt c/o lower left leg pain, states he was reaching for something and heard a pop in his calf/shin area. Took tylenol for pain.

## 2022-11-18 NOTE — Group Note (Deleted)

## 2022-12-10 DIAGNOSIS — Z419 Encounter for procedure for purposes other than remedying health state, unspecified: Secondary | ICD-10-CM | POA: Diagnosis not present

## 2022-12-18 ENCOUNTER — Encounter: Payer: Self-pay | Admitting: Medical Oncology

## 2022-12-18 ENCOUNTER — Emergency Department: Payer: Worker's Compensation

## 2022-12-18 ENCOUNTER — Emergency Department
Admission: EM | Admit: 2022-12-18 | Discharge: 2022-12-18 | Disposition: A | Discharge status: Home / Self Care | Payer: Worker's Compensation | Attending: Emergency Medicine | Admitting: Emergency Medicine

## 2022-12-18 DIAGNOSIS — Y990 Civilian activity done for income or pay: Secondary | ICD-10-CM | POA: Insufficient documentation

## 2022-12-18 DIAGNOSIS — S63635A Sprain of interphalangeal joint of left ring finger, initial encounter: Secondary | ICD-10-CM | POA: Diagnosis not present

## 2022-12-18 DIAGNOSIS — W311XXA Contact with metalworking machines, initial encounter: Secondary | ICD-10-CM | POA: Diagnosis not present

## 2022-12-18 DIAGNOSIS — S6992XA Unspecified injury of left wrist, hand and finger(s), initial encounter: Secondary | ICD-10-CM | POA: Diagnosis present

## 2022-12-18 NOTE — ED Provider Notes (Signed)
[facility] Provider Note    Event Date/Time   First MD Initiated Contact with Patient 12/18/22 [phone]     (approximate)   History   Finger Injury   HPI Jason Wise is a 27 y.o. male presenting today for finger injury.  Patient states he was at work when a piece of metal rolled over his left ring finger.  Notices pain at the tip of the finger along with swelling.  Mild numbness noted as well.  No trauma anywhere else of the hand.  No lacerations or cuts.     Physical Exam   Triage Vital Signs: ED Triage Vitals  Encounter Vitals Group     BP 12/18/22 0838 137/77     Systolic BP Percentile --      Diastolic BP Percentile --      Pulse Rate 12/18/22 0838 82     Resp 12/18/22 0838 16     Temp 12/18/22 0838 (!) 97.1 F (36.2 C)     Temp Source 12/18/22 0838 Oral     SpO2 12/18/22 0838 99 %     Weight 12/18/22 0839 143 lb (64.9 kg)     Height 12/18/22 0839 5\' 9"  (1.753 m)     Head Circumference --      Peak Flow --      Pain Score 12/18/22 0839 6     Pain Loc --      Pain Education --      Exclude from Growth Chart --     Most recent vital signs: Vitals:   12/18/22 0838  BP: 137/77  Pulse: 82  Resp: 16  Temp: (!) 97.1 F (36.2 C)  SpO2: 99%   I have reviewed the vital signs. General:  Awake, alert, no acute distress. Head:  Normocephalic, Atraumatic. EENT:  PERRL, EOMI, Oral mucosa pink and moist, Neck is supple. Cardiovascular: Regular rate, 2+ distal pulses. Respiratory:  Normal respiratory effort, symmetrical expansion, no distress.   Extremities:  Moving all four extremities through full ROM without pain.  Tenderness palpation over the palmar surface of the left fourth digit DIP.  Slight pain with flexion and inability to fully flex the finger.  Notes paresthesia to the tip.  Capillary refill intact and less than 2.  No lacerations noted.  No pain or tenderness palpation throughout the rest of the left hand. Neuro:  Alert and  oriented.  Interacting appropriately.   Skin:  Warm, dry, no rash.   Psych: Appropriate affect.    ED Results / Procedures / Treatments   Labs (all labs ordered are listed, but only abnormal results are displayed) Labs Reviewed - No data to display   EKG    RADIOLOGY Independently interpreted with no acute pathology.   PROCEDURES:  Critical Care performed: No  Procedures   MEDICATIONS ORDERED IN ED: Medications - No data to display   IMPRESSION / MDM / ASSESSMENT AND PLAN / ED COURSE  I reviewed the triage vital signs and the nursing notes.                              Differential diagnosis includes, but is not limited to, phalanx fracture, ligamentous injury to the finger, soft tissue swelling.  Patient's presentation is most consistent with acute complicated illness / injury requiring diagnostic workup.  Patient is a 27 year old male presenting today for left ring finger injury at work.  Physical exam notable  for some swelling on the palmar surface of the DIP of the fourth digit.  Some difficulty with fully flexing the DIP but otherwise no acute deformities.  Neurovascularly intact.  X-ray shows no acute fracture.  Suspect likely more ligamentous or tendon sprain at this time versus swelling causing difficulty with bending the finger.  Patient safe for discharge and given outpatient follow-up with orthopedics as needed if symptoms persist past the neck several days.  Clinical Course as of 12/18/22 1030  Wed Dec 18, 2022  4098 DG Finger Ring Left Independently interpreted with no acute fractures [DW]    Clinical Course User Index [DW] Janith Lima, MD     FINAL CLINICAL IMPRESSION(S) / ED DIAGNOSES   Final diagnoses:  Sprain of interphalangeal joint of left ring finger, initial encounter     Rx / DC Orders   ED Discharge Orders     None        Note:  This document was prepared using Dragon voice recognition software and may include  unintentional dictation errors.   Janith Lima, MD 12/18/22 1030

## 2022-12-18 NOTE — ED Triage Notes (Signed)
Pt was at work and a piece of metal rolled over his left hand ring finger. Pt here for W/C.

## 2022-12-18 NOTE — Discharge Instructions (Signed)
Please follow-up with orthopedics for ongoing evaluation if symptoms are persistent.

## 2022-12-30 ENCOUNTER — Emergency Department
Admission: EM | Admit: 2022-12-30 | Discharge: 2022-12-30 | Disposition: A | Discharge status: Home / Self Care | Payer: BLUE CROSS/BLUE SHIELD | Attending: Emergency Medicine | Admitting: Emergency Medicine

## 2022-12-30 ENCOUNTER — Encounter: Payer: Self-pay | Admitting: Emergency Medicine

## 2022-12-30 ENCOUNTER — Other Ambulatory Visit: Payer: Self-pay

## 2022-12-30 DIAGNOSIS — M5431 Sciatica, right side: Secondary | ICD-10-CM

## 2022-12-30 DIAGNOSIS — M5441 Lumbago with sciatica, right side: Secondary | ICD-10-CM | POA: Diagnosis not present

## 2022-12-30 DIAGNOSIS — M545 Low back pain, unspecified: Secondary | ICD-10-CM | POA: Diagnosis present

## 2022-12-30 MED ORDER — GABAPENTIN 300 MG PO CAPS: 300.0000 mg | ORAL_CAPSULE | Freq: Three times a day (TID) | ORAL | 0 refills | Status: DC | PRN

## 2022-12-30 NOTE — ED Notes (Signed)
See triage note Presents with lower back pain   Staets pain is moving into leg  Denies any injury  Ambulates well to treatment room

## 2022-12-30 NOTE — ED Provider Notes (Signed)
   [facility] Provider Note    Event Date/Time   First MD Initiated Contact with Patient 12/30/22 1039     (approximate)   History   Back Pain   HPI  Jason Wise is a 27 y.o. male who presents to the emergency department today because of concerns for right lower back pain that radiates down his right leg.  The patient says pain has been present for the past few days.  It does make it hard for him to walk.  He denies any unusual activity although works with heavy objects.  He denies any trauma.  He denies any difficulty with urination or defecation.     Physical Exam   Triage Vital Signs: ED Triage Vitals  Encounter Vitals Group     BP 12/30/22 0923 (!) 141/78     Systolic BP Percentile --      Diastolic BP Percentile --      Pulse Rate 12/30/22 0923 77     Resp 12/30/22 0923 16     Temp 12/30/22 0923 97.7 F (36.5 C)     Temp Source 12/30/22 0923 Oral     SpO2 12/30/22 0923 100 %     Weight --      Height --      Head Circumference --      Peak Flow --      Pain Score 12/30/22 0924 7     Pain Loc --      Pain Education --      Exclude from Growth Chart --     Most recent vital signs: Vitals:   12/30/22 0923  BP: (!) 141/78  Pulse: 77  Resp: 16  Temp: 97.7 F (36.5 C)  SpO2: 100%   General: Awake, alert, oriented. CV:  Good peripheral perfusion.  Resp:  Normal effort.  Abd:  No distention.  Other:  Tender to palpation over the right SI joint.   ED Results / Procedures / Treatments   Labs (all labs ordered are listed, but only abnormal results are displayed) Labs Reviewed - No data to display   EKG  None   RADIOLOGY None   PROCEDURES:  Critical Care performed: No   MEDICATIONS ORDERED IN ED: Medications - No data to display   IMPRESSION / MDM / ASSESSMENT AND PLAN / ED COURSE  I reviewed the triage vital signs and the nursing notes.                              Differential diagnosis includes, but is  not limited to, sciatica, muscle strain  Patient's presentation is most consistent with acute presentation with potential threat to life or bodily function.   Patient presented to the emergency department today with concerns for right lower back pain that radiates into his leg.  Clinical history and exam is consistent with sciatica.  I discussed this with the patient.  Will plan on giving prescription for gabapentin and exercises.   FINAL CLINICAL IMPRESSION(S) / ED DIAGNOSES   Final diagnoses:  Sciatica of right side     Note:  This document was prepared using Dragon voice recognition software and may include unintentional dictation errors.    Phineas Semen, MD 12/30/22 [phone]

## 2022-12-30 NOTE — ED Triage Notes (Signed)
Pt ambulatory to triage for lower back and hip pain. Says it feels like a pinched nerve. Nothing improves or worsens pain, has been going on for 3 days.

## 2023-01-10 DIAGNOSIS — Z419 Encounter for procedure for purposes other than remedying health state, unspecified: Secondary | ICD-10-CM | POA: Diagnosis not present

## 2023-02-09 DIAGNOSIS — Z419 Encounter for procedure for purposes other than remedying health state, unspecified: Secondary | ICD-10-CM | POA: Diagnosis not present

## 2023-03-12 DIAGNOSIS — Z419 Encounter for procedure for purposes other than remedying health state, unspecified: Secondary | ICD-10-CM | POA: Diagnosis not present

## 2023-03-24 ENCOUNTER — Emergency Department (HOSPITAL_COMMUNITY): Payer: BLUE CROSS/BLUE SHIELD

## 2023-03-24 ENCOUNTER — Encounter (HOSPITAL_COMMUNITY): Payer: Self-pay

## 2023-03-24 ENCOUNTER — Emergency Department (HOSPITAL_COMMUNITY)
Admission: EM | Admit: 2023-03-24 | Discharge: 2023-03-24 | Disposition: A | Discharge status: Home / Self Care | Payer: BLUE CROSS/BLUE SHIELD | Attending: Emergency Medicine | Admitting: Emergency Medicine

## 2023-03-24 ENCOUNTER — Other Ambulatory Visit: Payer: Self-pay

## 2023-03-24 DIAGNOSIS — S6010XA Contusion of unspecified finger with damage to nail, initial encounter: Secondary | ICD-10-CM

## 2023-03-24 DIAGNOSIS — S60011A Contusion of right thumb without damage to nail, initial encounter: Secondary | ICD-10-CM | POA: Insufficient documentation

## 2023-03-24 DIAGNOSIS — W231XXA Caught, crushed, jammed, or pinched between stationary objects, initial encounter: Secondary | ICD-10-CM | POA: Diagnosis not present

## 2023-03-24 DIAGNOSIS — S6991XA Unspecified injury of right wrist, hand and finger(s), initial encounter: Secondary | ICD-10-CM | POA: Diagnosis present

## 2023-03-24 MED ORDER — HYDROCODONE-ACETAMINOPHEN 5-325 MG PO TABS: 1.0000 | ORAL_TABLET | Freq: Four times a day (QID) | ORAL | 0 refills | Status: DC | PRN

## 2023-03-24 MED ORDER — IBUPROFEN 600 MG PO TABS: 600.0000 mg | ORAL_TABLET | Freq: Four times a day (QID) | ORAL | 0 refills | Status: DC | PRN

## 2023-03-24 NOTE — Discharge Instructions (Signed)
 I recommend ice and elevation is much as you can to help minimize pain of your thumb.  This injury is too old to benefit from draining the subungual hematoma.  You are being prescribed medication to help you with pain and swelling.  The thumb splint may also help protect this injury.  Do not be surprised if at some point in the next several months as his nail grows out it may loosen from the base and eventually fall out, however a new nail should replace it, it will take 4 to 6 months for this to happen.  I do recommend following up with orthopedics for recheck of your injury if you are not getting improvement in the pain over the next week with this treatment plan.  Your x-rays are negative for any bony injuries.

## 2023-03-24 NOTE — ED Triage Notes (Signed)
 Pt arrived via POV c/o right thumb injury that occurred Friday. Pt reports he was working outside and smashed/ crushed his thumb.

## 2023-03-24 NOTE — ED Provider Notes (Signed)
 [place] EMERGENCY DEPARTMENT AT [facility] Provider Note   CSN: [id number] Arrival date & time: 03/24/23  1029     History  No chief complaint on file.   Jason Wise is a 28 y.o. male , right handed male, presenting with crush injury to his right thumb occurring 3 days ago, describes it being caught between a board and piece of metal.  He reports swelling and discoloration beneath the nail plate along with numbness along the lateral edge of the thumb.  He has used ice and nsaids with no improvement prior to arrival.   The history is provided by the patient.       Home Medications Prior to Admission medications   Medication Sig Start Date End Date Taking? Authorizing Provider  HYDROcodone -acetaminophen  (NORCO/VICODIN) 5-325 MG tablet Take 1 tablet by mouth every 6 (six) hours as needed for severe pain (pain score 7-10). 03/24/23  Yes Carisa Backhaus, PA-C  ibuprofen  (ADVIL ) 600 MG tablet Take 1 tablet (600 mg total) by mouth every 6 (six) hours as needed. 03/24/23  Yes Luverna Degenhart, PA-C  amoxicillin  (AMOXIL ) 500 MG capsule Take 1 capsule (500 mg total) by mouth 3 (three) times daily. Patient not taking: Reported on 06/24/2018 05/12/18   Gasper Devere ORN, PA-C  FLUoxetine  (PROZAC ) 10 MG capsule Take 1 capsule (10 mg total) by mouth at bedtime. Patient not taking: Reported on 06/24/2018 05/04/16   Yvonna Alm Cough, MD  gabapentin  (NEURONTIN ) 300 MG capsule Take 1 capsule (300 mg total) by mouth 3 (three) times daily as needed (sciatica pain). 12/30/22 12/30/23  Floy Roberts, MD  hydrOXYzine  (ATARAX /VISTARIL ) 25 MG tablet Take 1 tablet (25 mg total) by mouth 3 (three) times daily as needed. Patient not taking: Reported on 06/24/2018 09/09/16   Alona Knee, PA-C  OLANZapine  (ZYPREXA ) 2.5 MG tablet Take 0.5 tablets (1.25 mg total) by mouth at bedtime. 05/04/16 05/04/17  Yvonna Alm Cough, MD  predniSONE  (STERAPRED UNI-PAK 21 TAB) 10 MG (21) TBPK tablet Take 6 pills on  day one then decrease by 1 pill each day Patient not taking: Reported on 06/24/2018 05/12/18   Gasper Devere ORN, PA-C      Allergies    Patient has no known allergies.    Review of Systems   Review of Systems  Constitutional:  Negative for fever.  Musculoskeletal:  Positive for arthralgias. Negative for joint swelling and myalgias.  Skin:  Positive for color change.  Neurological:  Positive for numbness. Negative for weakness.  All other systems reviewed and are negative.   Physical Exam Updated Vital Signs BP (!) 141/67 (BP Location: Right Arm)   Pulse 95   Temp 98.6 F (37 C) (Oral)   Resp 15   Ht 5' 9 (1.753 m)   Wt 65 kg   SpO2 98%   BMI 21.16 kg/m  Physical Exam Constitutional:      Appearance: He is well-developed.  HENT:     Head: Atraumatic.  Cardiovascular:     Comments: Pulses equal bilaterally Musculoskeletal:        General: Tenderness present.     Right hand: Swelling and bony tenderness present. No deformity.     Cervical back: Normal range of motion.     Comments: TTP right distal thumb ,  proximal nail hematoma.  Less than 2 sec cap refill in fingertip.  Decreased sensation ulnar thumb edge distally.   Skin:    General: Skin is warm and dry.  Neurological:  Mental Status: He is alert.     Sensory: No sensory deficit.     Motor: No weakness.     Deep Tendon Reflexes: Reflexes normal.     ED Results / Procedures / Treatments   Labs (all labs ordered are listed, but only abnormal results are displayed) Labs Reviewed - No data to display  EKG None  Radiology DG Hand Complete Right Result Date: 03/24/2023 CLINICAL DATA:  Trauma to the right hand. EXAM: RIGHT HAND - COMPLETE 3+ VIEW COMPARISON:  Right wrist radiograph dated 11/14/2015. FINDINGS: There is no evidence of fracture or dislocation. There is no evidence of arthropathy or other focal bone abnormality. Soft tissues are unremarkable. IMPRESSION: Negative. Electronically Signed   By: Vanetta Chou M.D.   On: 03/24/2023 12:25    Procedures Procedures    Medications Ordered in ED Medications - No data to display  ED Course/ Medical Decision Making/ A&P                                 Medical Decision Making Pt presenting with crush like injury to the right thumb with a small proximal nailbed hematoma.  Imaging negative for fracture/dislocation.  Given hematoma more than 44 days old,  trephination felt less helpful with increased potential for infection so deferred.    Ice,  elevation,  thumb splint provided, advised ibuprofen , few hydrocodone  prescribed for pain.  PRN f/u with hand speciality if pain is not improving over the next week.  Discussed to expect gradual growing out of the hematoma and may expect possible nail loss in several months, although currently nail plate intact.    Amount and/or Complexity of Data Reviewed Radiology: ordered.    Details: Reviewed,  no fracture or dislocation.   Risk Prescription drug management.           Final Clinical Impression(s) / ED Diagnoses Final diagnoses:  Subungual hematoma of digit of hand, initial encounter    Rx / DC Orders ED Discharge Orders          Ordered    ibuprofen  (ADVIL ) 600 MG tablet  Every 6 hours PRN        03/24/23 1326    HYDROcodone -acetaminophen  (NORCO/VICODIN) 5-325 MG tablet  Every 6 hours PRN        03/24/23 1326              Riham Polyakov, PA-C 03/24/23 1430    Melvenia Motto, MD 03/24/23 1640

## 2023-04-08 ENCOUNTER — Emergency Department (HOSPITAL_COMMUNITY)
Admission: EM | Admit: 2023-04-08 | Discharge: 2023-04-08 | Disposition: A | Discharge status: Home / Self Care | Payer: Worker's Compensation | Attending: Emergency Medicine | Admitting: Emergency Medicine

## 2023-04-08 ENCOUNTER — Emergency Department (HOSPITAL_COMMUNITY): Payer: BLUE CROSS/BLUE SHIELD

## 2023-04-08 ENCOUNTER — Encounter (HOSPITAL_COMMUNITY): Payer: Self-pay | Admitting: Emergency Medicine

## 2023-04-08 DIAGNOSIS — S0083XA Contusion of other part of head, initial encounter: Secondary | ICD-10-CM | POA: Insufficient documentation

## 2023-04-08 DIAGNOSIS — S0990XA Unspecified injury of head, initial encounter: Secondary | ICD-10-CM | POA: Diagnosis present

## 2023-04-08 DIAGNOSIS — W228XXA Striking against or struck by other objects, initial encounter: Secondary | ICD-10-CM | POA: Diagnosis not present

## 2023-04-08 NOTE — Discharge Instructions (Signed)
Return if any problems.   Tylenol for pain

## 2023-04-08 NOTE — ED Provider Notes (Signed)
[place] EMERGENCY DEPARTMENT AT [facility] Provider Note   CSN: [id number] Arrival date & time: 04/08/23  4401     History  Chief Complaint  Patient presents with   Facial Injury    Jason Wise is a 28 y.o. male.  Patient reports a drill hit him in the face.  Patient complains of swelling and pain around his left eye.  Patient states he did not lose consciousness he does have a headache.  Patient reports that the drill did not strike his eye.  Patient denies any difficulty with vision.  Patient reports that no parts broke from the drill.  The history is provided by the patient. No language interpreter was used.  Facial Injury Location:  Face Pain details:    Quality:  Aching Foreign body present:  No foreign bodies Relieved by:  Nothing Ineffective treatments:  None tried Associated symptoms: no altered mental status and no epistaxis        Home Medications Prior to Admission medications   Medication Sig Start Date End Date Taking? Authorizing Provider  amoxicillin (AMOXIL) 500 MG capsule Take 1 capsule (500 mg total) by mouth 3 (three) times daily. Patient not taking: Reported on 06/24/2018 05/12/18   Faythe Ghee, PA-C  FLUoxetine (PROZAC) 10 MG capsule Take 1 capsule (10 mg total) by mouth at bedtime. Patient not taking: Reported on 06/24/2018 05/04/16   Myrna Blazer, MD  gabapentin (NEURONTIN) 300 MG capsule Take 1 capsule (300 mg total) by mouth 3 (three) times daily as needed (sciatica pain). 12/30/22 12/30/23  Phineas Semen, MD  HYDROcodone-acetaminophen (NORCO/VICODIN) 5-325 MG tablet Take 1 tablet by mouth every 6 (six) hours as needed for severe pain (pain score 7-10). 03/24/23   Burgess Amor, PA-C  hydrOXYzine (ATARAX/VISTARIL) 25 MG tablet Take 1 tablet (25 mg total) by mouth 3 (three) times daily as needed. Patient not taking: Reported on 06/24/2018 09/09/16   Enid Derry, PA-C  ibuprofen (ADVIL) 600 MG tablet Take 1 tablet (600 mg  total) by mouth every 6 (six) hours as needed. 03/24/23   Burgess Amor, PA-C  OLANZapine (ZYPREXA) 2.5 MG tablet Take 0.5 tablets (1.25 mg total) by mouth at bedtime. 05/04/16 05/04/17  Myrna Blazer, MD  predniSONE (STERAPRED UNI-PAK 21 TAB) 10 MG (21) TBPK tablet Take 6 pills on day one then decrease by 1 pill each day Patient not taking: Reported on 06/24/2018 05/12/18   Faythe Ghee, PA-C      Allergies    Patient has no known allergies.    Review of Systems   Review of Systems  HENT:  Negative for nosebleeds.   All other systems reviewed and are negative.   Physical Exam Updated Vital Signs BP 122/71   Pulse 80   Temp 98.4 F (36.9 C) (Oral)   Resp 16   Ht 5\' 9"  (1.753 m)   Wt 59.9 kg   SpO2 100%   BMI 19.49 kg/m  Physical Exam Vitals and nursing note reviewed.  Constitutional:      General: He is not in acute distress.    Appearance: He is well-developed.  HENT:     Head: Normocephalic.     Comments: Swollen area areas side of left face slight tenderness lateral orbital wall Eyes:     Extraocular Movements: Extraocular movements intact.     Conjunctiva/sclera: Conjunctivae normal.     Pupils: Pupils are equal, round, and reactive to light.     Comments: Left eye normal  eye exam no sign of trauma extraocular movements intact  Cardiovascular:     Rate and Rhythm: Normal rate and regular rhythm.     Heart sounds: No murmur heard. Pulmonary:     Effort: Pulmonary effort is normal. No respiratory distress.     Breath sounds: Normal breath sounds.  Abdominal:     Palpations: Abdomen is soft.     Tenderness: There is no abdominal tenderness.  Musculoskeletal:        General: No swelling.     Cervical back: Neck supple.  Skin:    General: Skin is warm and dry.     Capillary Refill: Capillary refill takes less than 2 seconds.  Neurological:     Mental Status: He is alert.  Psychiatric:        Mood and Affect: Mood normal.     ED Results / Procedures /  Treatments   Labs (all labs ordered are listed, but only abnormal results are displayed) Labs Reviewed - No data to display  EKG None  Radiology CT Head Wo Contrast Result Date: 04/08/2023 CLINICAL DATA:  Trauma to the head and face.  Struck by Dual. EXAM: CT HEAD WITHOUT CONTRAST CT MAXILLOFACIAL WITHOUT CONTRAST TECHNIQUE: Multidetector CT imaging of the head and maxillofacial structures were performed using the standard protocol without intravenous contrast. Multiplanar CT image reconstructions of the maxillofacial structures were also generated. RADIATION DOSE REDUCTION: This exam was performed according to the departmental dose-optimization program which includes automated exposure control, adjustment of the mA and/or kV according to patient size and/or use of iterative reconstruction technique. COMPARISON:  11/04/2013 FINDINGS: CT HEAD FINDINGS Brain: The brain shows a normal appearance without evidence of malformation, atrophy, old or acute small or large vessel infarction, mass lesion, hemorrhage, hydrocephalus or extra-axial collection. Vascular: No hyperdense vessel. No evidence of atherosclerotic calcification. Skull: Normal.  No traumatic finding.  No focal bone lesion. Sinuses/Orbits: Sinuses are clear. Orbits appear normal. Mastoids are clear. Other: None significant CT MAXILLOFACIAL FINDINGS Osseous: No regional fracture.  Dental and periodontal disease. Orbits: Normal Sinuses: Clear Soft tissues: No significant soft tissue finding by CT. IMPRESSION: HEAD CT: Normal. MAXILLOFACIAL CT: No traumatic finding. Dental and periodontal disease. Electronically Signed   By: Paulina Fusi M.D.   On: 04/08/2023 11:49   CT Maxillofacial Wo Contrast Result Date: 04/08/2023 CLINICAL DATA:  Trauma to the head and face.  Struck by Dual. EXAM: CT HEAD WITHOUT CONTRAST CT MAXILLOFACIAL WITHOUT CONTRAST TECHNIQUE: Multidetector CT imaging of the head and maxillofacial structures were performed using the  standard protocol without intravenous contrast. Multiplanar CT image reconstructions of the maxillofacial structures were also generated. RADIATION DOSE REDUCTION: This exam was performed according to the departmental dose-optimization program which includes automated exposure control, adjustment of the mA and/or kV according to patient size and/or use of iterative reconstruction technique. COMPARISON:  11/04/2013 FINDINGS: CT HEAD FINDINGS Brain: The brain shows a normal appearance without evidence of malformation, atrophy, old or acute small or large vessel infarction, mass lesion, hemorrhage, hydrocephalus or extra-axial collection. Vascular: No hyperdense vessel. No evidence of atherosclerotic calcification. Skull: Normal.  No traumatic finding.  No focal bone lesion. Sinuses/Orbits: Sinuses are clear. Orbits appear normal. Mastoids are clear. Other: None significant CT MAXILLOFACIAL FINDINGS Osseous: No regional fracture.  Dental and periodontal disease. Orbits: Normal Sinuses: Clear Soft tissues: No significant soft tissue finding by CT. IMPRESSION: HEAD CT: Normal. MAXILLOFACIAL CT: No traumatic finding. Dental and periodontal disease. Electronically Signed   By: Loraine Leriche  Shogry M.D.   On: 04/08/2023 11:49    Procedures Procedures    Medications Ordered in ED Medications - No data to display  ED Course/ Medical Decision Making/ A&P                                 Medical Decision Making Patient reports while at work a drill broke and flew hitting him in the left side of his face.  Amount and/or Complexity of Data Reviewed Radiology: ordered and independent interpretation performed. Decision-making details documented in ED Course.    Details: CT head and CT maxillofacial showed no acute injuries  Risk Risk Details: Patient has bruising to the left side of his face to the orbital area.  He does not have any evidence of injury to his eye.  Patient is diagnosed with information on contusions  and advised to return if any problems.           Final Clinical Impression(s) / ED Diagnoses Final diagnoses:  Contusion of face, initial encounter    Rx / DC Orders ED Discharge Orders     None      An After Visit Summary was printed and given to the patient.    Elson Areas, Cordelia Poche 04/08/23 1158    Cathren Laine, MD 04/08/23 [phone]

## 2023-04-08 NOTE — ED Triage Notes (Addendum)
Pt was hit in by a work drill on the left side of his face. Denies LOC. Small abrasion noted. C/o of blurred vision to left eye.

## 2023-04-12 DIAGNOSIS — Z419 Encounter for procedure for purposes other than remedying health state, unspecified: Secondary | ICD-10-CM | POA: Diagnosis not present

## 2023-05-10 DIAGNOSIS — Z419 Encounter for procedure for purposes other than remedying health state, unspecified: Secondary | ICD-10-CM | POA: Diagnosis not present

## 2023-06-05 ENCOUNTER — Emergency Department
Admission: EM | Admit: 2023-06-05 | Discharge: 2023-06-05 | Disposition: A | Discharge status: Home / Self Care | Attending: Emergency Medicine | Admitting: Emergency Medicine

## 2023-06-05 ENCOUNTER — Other Ambulatory Visit: Payer: Self-pay

## 2023-06-05 DIAGNOSIS — L03114 Cellulitis of left upper limb: Secondary | ICD-10-CM | POA: Diagnosis not present

## 2023-06-05 DIAGNOSIS — R21 Rash and other nonspecific skin eruption: Secondary | ICD-10-CM | POA: Diagnosis present

## 2023-06-05 MED ORDER — DOXYCYCLINE HYCLATE 100 MG PO CAPS: 100.0000 mg | ORAL_CAPSULE | Freq: Two times a day (BID) | ORAL | 0 refills | Status: AC

## 2023-06-05 NOTE — Discharge Instructions (Signed)
 Take the antibiotics as prescribed.  Make sure you take all the medication.  This should begin to improve in about 48 hours.  If the redness is getting worse or you develop a fever please return to the emergency department.  I have attached information for neurology.  Please schedule follow-up appointment with them.  You can take 650 mg of Tylenol and 600 mg of ibuprofen every 6 hours as needed for pain.

## 2023-06-05 NOTE — ED Notes (Signed)
 See triage note  Presents with rash  and some itching to left arm   Areas is also red  Developed couple of days ago

## 2023-06-05 NOTE — ED Triage Notes (Signed)
 Patient states red, burning and itchy rash to left arm; states it started after he gave himself a tattoo.

## 2023-06-05 NOTE — ED Provider Notes (Signed)
 [facility] Provider Note    Event Date/Time   First MD Initiated Contact with Patient 06/05/23 1037     (approximate)   History   Rash   HPI  TAMAJ JURGENS is a 28 y.o. male with PMH of bipolar disorder who presents for evaluation of a rash to the left arm.  Patient states it is red burning and itchy.  This began after he gave himself a tattoo.  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  Also mentions that he has ongoing headaches and had a concussion that occurred earlier this month.      Physical Exam   Triage Vital Signs: ED Triage Vitals  Encounter Vitals Group     BP 06/05/23 1023 123/78     Systolic BP Percentile --      Diastolic BP Percentile --      Pulse Rate 06/05/23 1023 95     Resp 06/05/23 1023 18     Temp 06/05/23 1023 98.4 F (36.9 C)     Temp Source 06/05/23 1023 Oral     SpO2 06/05/23 1023 100 %     Weight 06/05/23 1023 135 lb (61.2 kg)     Height 06/05/23 1023 5\' 9"  (1.753 m)     Head Circumference --      Peak Flow --      Pain Score 06/05/23 1024 6     Pain Loc --      Pain Education --      Exclude from Growth Chart --     Most recent vital signs: Vitals:   06/05/23 1023  BP: 123/78  Pulse: 95  Resp: 18  Temp: 98.4 F (36.9 C)  SpO2: 100%   General: Awake, no distress.  CV:  Good peripheral perfusion.  Resp:  Normal effort.  Abd:  No distention.  Other:  Erythematous, warm and peeling skin following the shape of a [place] tattoo on the left [facility] with a few surrounding pustules   ED Results / Procedures / Treatments   Labs (all labs ordered are listed, but only abnormal results are displayed) Labs Reviewed - No data to display   PROCEDURES:  Critical Care performed: No  Procedures   MEDICATIONS ORDERED IN ED: Medications - No data to display   IMPRESSION / MDM / ASSESSMENT AND PLAN / ED COURSE  I reviewed the triage vital signs and the nursing  notes.                             28 year old male presents for evaluation of a rash to the left arm.  Vital signs are stable patient NAD on exam.  Differential diagnosis includes, but is not limited to, cellulitis, folliculitis, contact dermatitis, allergic reaction, postconcussive syndrome.  Patient's presentation is most consistent with acute, uncomplicated illness.  Patient's tattoo appears to be infected.  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  I reviewed wound care and return precautions.  Patient also mentions having ongoing after head injury.  I reviewed patient's chart and saw that he was evaluated on 1/28 for this head injury.  He presented to the emergency department at [facility] again on 2/7 and was diagnosed with a concussion.  They wanted him to follow-up with neurology.  Patient states that he did not have transportation to get there.  Since patient has had ongoing symptoms after a head injury I am most concerned about postconcussive syndrome.  Do not feel that further imaging is warranted at this time.  Give patient information for neurology follow-up locally.  Patient was agreeable to plan, voiced understanding was stable at discharge.      FINAL CLINICAL IMPRESSION(S) / ED DIAGNOSES   Final diagnoses:  Cellulitis of left upper extremity     Rx / DC Orders   ED Discharge Orders          Ordered    doxycycline (VIBRAMYCIN) 100 MG capsule  2 times daily        06/05/23 1216             Note:  This document was prepared using Dragon voice recognition software and may include unintentional dictation errors.   Cameron Ali, PA-C 06/05/23 1216    Minna Antis, MD 06/05/23 1357

## 2023-09-29 ENCOUNTER — Other Ambulatory Visit: Payer: Self-pay

## 2023-09-29 ENCOUNTER — Emergency Department: Payer: MEDICAID

## 2023-09-29 DIAGNOSIS — R072 Precordial pain: Secondary | ICD-10-CM | POA: Insufficient documentation

## 2023-09-29 DIAGNOSIS — R002 Palpitations: Secondary | ICD-10-CM | POA: Insufficient documentation

## 2023-09-29 LAB — BASIC METABOLIC PANEL WITH GFR
Anion gap: 10 (ref 5–15)
BUN: 11 mg/dL (ref 6–20)
CO2: 27 mmol/L (ref 22–32)
Calcium: 9.7 mg/dL (ref 8.9–10.3)
Chloride: 103 mmol/L (ref 98–111)
Creatinine, Ser: 0.9 mg/dL (ref 0.61–1.24)
GFR, Estimated: >60 mL/min (ref >60)
Glucose, Bld: 135 mg/dL — ABNORMAL HIGH (ref 70–99)
Potassium: 3.5 mmol/L (ref 3.5–5.1)
Sodium: 140 mmol/L (ref 135–145)

## 2023-09-29 LAB — CBC
HCT: 46.4 % (ref 39.0–52.0)
Hemoglobin: 15.9 g/dL (ref 13.0–17.0)
MCH: 29.2 pg (ref 26.0–34.0)
MCHC: 34.3 g/dL (ref 30.0–36.0)
MCV: 85.1 fL (ref 80.0–100.0)
Platelets: 275 K/uL (ref 150–400)
RBC: 5.45 MIL/uL (ref 4.22–5.81)
RDW: 12.5 % (ref 11.5–15.5)
WBC: 9.7 K/uL (ref 4.0–10.5)
nRBC: 0 % (ref 0.0–0.2)

## 2023-09-29 LAB — TROPONIN I (HIGH SENSITIVITY): Troponin I (High Sensitivity): <2 ng/L (ref <18)

## 2023-09-29 NOTE — ED Triage Notes (Signed)
 Pt to ED via POV c/o central CP that radiates to the left chest x3 weeks. Got worse today. Endorses some generalized weakness, denies SOB, fevers, dizziness.

## 2023-09-30 ENCOUNTER — Other Ambulatory Visit: Payer: Self-pay

## 2023-09-30 ENCOUNTER — Emergency Department
Admission: EM | Admit: 2023-09-30 | Discharge: 2023-09-30 | Disposition: A | Discharge status: Home / Self Care | Payer: MEDICAID | Attending: Emergency Medicine | Admitting: Emergency Medicine

## 2023-09-30 DIAGNOSIS — R079 Chest pain, unspecified: Secondary | ICD-10-CM

## 2023-09-30 DIAGNOSIS — R002 Palpitations: Secondary | ICD-10-CM

## 2023-09-30 NOTE — ED Notes (Signed)
 EKG wasn't preformed on a chest pain in triage..... EKG was just preformed and given to Physician

## 2023-09-30 NOTE — ED Provider Notes (Signed)
 [facility] Provider Note    Event Date/Time   First MD Initiated Contact with Patient 09/30/23 [phone]     (approximate)   History   Chest Pain   HPI  Jason Wise is a 28 y.o. male past medical history significant for nicotine  use, who presents to the emergency department for chest pain.  Patient states that he has had intermittent episodes of chest pain over the past 3 to 4 weeks.  States that he is finally able to get a ride to come over to the emergency department today which is why he came in.  Denies any significant chest pain at this time.  States that he will get substernal chest pain and feels like his heart is racing and radiates to his left side.  Denies nausea or vomiting.  States that he will intermittently get sweaty.  States that this usually occurs while he was driving in the car.  Denies any significant shortness of breath.  Does endorse nicotine  use.  Family history in his father of coronary artery disease at a young age at age 26.  No history of DVT or PE.  Denies any significant alcohol use.  No change with eating.  Not worse with laying down.  Denies any history of cocaine use.       Physical Exam   Triage Vital Signs: ED Triage Vitals  Encounter Vitals Group     BP 09/29/23 2103 135/72     Girls Systolic BP Percentile --      Girls Diastolic BP Percentile --      Boys Systolic BP Percentile --      Boys Diastolic BP Percentile --      Pulse Rate 09/29/23 2103 100     Resp 09/29/23 2103 18     Temp 09/29/23 2103 98.7 F (37.1 C)     Temp Source 09/29/23 2103 Oral     SpO2 09/29/23 2103 100 %     Weight 09/29/23 2101 123 lb (55.8 kg)     Height 09/29/23 2101 5' 9 (1.753 m)     Head Circumference --      Peak Flow --      Pain Score 09/29/23 2101 9     Pain Loc --      Pain Education --      Exclude from Growth Chart --     Most recent vital signs: Vitals:   09/30/23 0032 09/30/23 0237  BP: 125/69 119/72  Pulse: 84 63   Resp: 18 18  Temp:    SpO2: 100% 100%    Physical Exam Constitutional:      Appearance: He is well-developed.  HENT:     Head: Atraumatic.  Eyes:     Conjunctiva/sclera: Conjunctivae normal.  Cardiovascular:     Rate and Rhythm: Regular rhythm.     Pulses:          Radial pulses are 2+ on the right side and 2+ on the left side.       Dorsalis pedis pulses are 2+ on the right side and 2+ on the left side.     Heart sounds: Normal heart sounds. No murmur heard. Pulmonary:     Effort: No respiratory distress.  Abdominal:     Palpations: Abdomen is soft.     Tenderness: There is no abdominal tenderness.  Musculoskeletal:        General: Normal range of motion.     Cervical back: Normal range  of motion.     Right lower leg: No edema.     Left lower leg: No edema.  Skin:    General: Skin is warm.     Capillary Refill: Capillary refill takes less than 2 seconds.  Neurological:     General: No focal deficit present.     Mental Status: He is alert. Mental status is at baseline.     IMPRESSION / MDM / ASSESSMENT AND PLAN / ED COURSE  I reviewed the triage vital signs and the nursing notes.  Differential diagnosis including ACS, pneumothorax, pneumonia, pleurisy, gastritis/PUD, anxiety  EKG  I, Clotilda Punter, the attending physician, personally viewed and interpreted this ECG.   Rate: Normal  Rhythm: Normal sinus  Axis: Normal  Intervals: Normal  ST&T Change: None  No tachycardic or bradycardic dysrhythmias while on cardiac telemetry.  RADIOLOGY I independently reviewed imaging, my interpretation of imaging: Chest x-ray with no signs of pneumonia no pneumothorax  LABS (all labs ordered are listed, but only abnormal results are displayed) Labs interpreted as -    Labs Reviewed  BASIC METABOLIC PANEL WITH GFR - Abnormal; Notable for the following components:      Result Value   Glucose, Bld 135 (*)    All other components within normal limits  CBC  TROPONIN I  (HIGH SENSITIVITY)     MDM    Lab work overall reassuring with no significant leukocytosis or anemia.  Creatinine at baseline.  Serial troponins are negative but have low suspicion for ACS, heart score of 2.  Low risk Wells criteria and PERC negative have low suspicion for PE or DVT.  No tearing chest pain and pulses are equal and symmetric, have low suspicion for dissection.  Abdomen nontender palpation have low suspicion for referred pain.  No concern for acute cholecystitis.  Low suspicion for acute pancreatitis, no abdominal pain or nausea or vomiting.  Chest x-ray with no signs of pneumonia no widened mediastinum.  Clinical picture is not consistent with acute pericarditis.  Possibly gastritis or PUD.  Given information to follow-up as an outpatient with primary care and cardiology.  Discussed return to the emergency department for any ongoing or worsening symptoms.   PROCEDURES:  Critical Care performed: No  Procedures  Patient's presentation is most consistent with acute presentation with potential threat to life or bodily function.   MEDICATIONS ORDERED IN ED: Medications - No data to display  FINAL CLINICAL IMPRESSION(S) / ED DIAGNOSES   Final diagnoses:  Chest pain, unspecified type  Palpitations     Rx / DC Orders   ED Discharge Orders          Ordered    Ambulatory referral to Cardiology       Comments: If you have not heard from the Cardiology office within the next 72 hours please call [phone].   09/30/23 0235    Ambulatory Referral to Primary Care (Establish Care)        09/30/23 0235             Note:  This document was prepared using Dragon voice recognition software and may include unintentional dictation errors.   Punter Clotilda, MD 09/30/23 [phone]

## 2023-09-30 NOTE — Discharge Instructions (Signed)
 You are seen in the emergency department for chest pain that has been ongoing and intermittent for the past couple of weeks.  Your EKG did not show any findings of an irregular heartbeat.  Your chest x-ray was normal.  Your lab work was normal.  It is importantly follow-up closely with a primary care physician and cardiology.  Return to the emergency department if you have any worsening symptoms.  Try to stop vaping nicotine 

## 2023-10-20 ENCOUNTER — Emergency Department: Payer: Self-pay

## 2023-10-20 ENCOUNTER — Other Ambulatory Visit: Payer: Self-pay

## 2023-10-20 ENCOUNTER — Inpatient Hospital Stay
Admission: EM | Admit: 2023-10-20 | Discharge: 2023-10-22 | DRG: 176 | Disposition: A | Discharge status: Home / Self Care | Payer: Self-pay | Attending: Student | Admitting: Student

## 2023-10-20 DIAGNOSIS — I2694 Multiple subsegmental pulmonary emboli without acute cor pulmonale: Secondary | ICD-10-CM

## 2023-10-20 DIAGNOSIS — R079 Chest pain, unspecified: Principal | ICD-10-CM

## 2023-10-20 DIAGNOSIS — I2693 Single subsegmental pulmonary embolism without acute cor pulmonale: Principal | ICD-10-CM | POA: Diagnosis present

## 2023-10-20 DIAGNOSIS — F410 Panic disorder [episodic paroxysmal anxiety] without agoraphobia: Secondary | ICD-10-CM | POA: Diagnosis present

## 2023-10-20 DIAGNOSIS — Z79899 Other long term (current) drug therapy: Secondary | ICD-10-CM

## 2023-10-20 DIAGNOSIS — F17290 Nicotine dependence, other tobacco product, uncomplicated: Secondary | ICD-10-CM | POA: Diagnosis present

## 2023-10-20 DIAGNOSIS — I2699 Other pulmonary embolism without acute cor pulmonale: Secondary | ICD-10-CM | POA: Insufficient documentation

## 2023-10-20 DIAGNOSIS — Z7901 Long term (current) use of anticoagulants: Secondary | ICD-10-CM

## 2023-10-20 DIAGNOSIS — Z8249 Family history of ischemic heart disease and other diseases of the circulatory system: Secondary | ICD-10-CM

## 2023-10-20 DIAGNOSIS — F17210 Nicotine dependence, cigarettes, uncomplicated: Secondary | ICD-10-CM | POA: Diagnosis present

## 2023-10-20 DIAGNOSIS — F319 Bipolar disorder, unspecified: Secondary | ICD-10-CM | POA: Diagnosis present

## 2023-10-20 DIAGNOSIS — I2510 Atherosclerotic heart disease of native coronary artery without angina pectoris: Secondary | ICD-10-CM | POA: Diagnosis present

## 2023-10-20 LAB — CBC
HCT: 47.3 % (ref 39.0–52.0)
Hemoglobin: 16 g/dL (ref 13.0–17.0)
MCH: 29.1 pg (ref 26.0–34.0)
MCHC: 33.8 g/dL (ref 30.0–36.0)
MCV: 86 fL (ref 80.0–100.0)
Platelets: 282 K/uL (ref 150–400)
RBC: 5.5 MIL/uL (ref 4.22–5.81)
RDW: 13 % (ref 11.5–15.5)
WBC: 10.4 K/uL (ref 4.0–10.5)
nRBC: 0 % (ref 0.0–0.2)

## 2023-10-20 LAB — BASIC METABOLIC PANEL WITH GFR
Anion gap: 11 (ref 5–15)
BUN: 7 mg/dL (ref 6–20)
CO2: 28 mmol/L (ref 22–32)
Calcium: 9.6 mg/dL (ref 8.9–10.3)
Chloride: 104 mmol/L (ref 98–111)
Creatinine, Ser: 0.79 mg/dL (ref 0.61–1.24)
GFR, Estimated: >60 mL/min (ref >60)
Glucose, Bld: 86 mg/dL (ref 70–99)
Potassium: 3.8 mmol/L (ref 3.5–5.1)
Sodium: 143 mmol/L (ref 135–145)

## 2023-10-20 LAB — HEPATIC FUNCTION PANEL
ALT: 16 U/L (ref 0–44)
AST: 22 U/L (ref 15–41)
Albumin: 4.4 g/dL (ref 3.5–5.0)
Alkaline Phosphatase: 49 U/L (ref 38–126)
Bilirubin, Direct: 0.2 mg/dL (ref 0.0–0.2)
Indirect Bilirubin: 0.9 mg/dL (ref 0.3–0.9)
Total Bilirubin: 1.1 mg/dL (ref 0.0–1.2)
Total Protein: 7.5 g/dL (ref 6.5–8.1)

## 2023-10-20 LAB — APTT: aPTT: 117 s — ABNORMAL HIGH (ref 24–36)

## 2023-10-20 LAB — PROTIME-INR
INR: 1.2 (ref 0.8–1.2)
Prothrombin Time: 15.5 s — ABNORMAL HIGH (ref 11.4–15.2)

## 2023-10-20 LAB — TROPONIN I (HIGH SENSITIVITY)
Troponin I (High Sensitivity): 3 ng/L (ref <18)
Troponin I (High Sensitivity): 3 ng/L (ref <18)

## 2023-10-20 LAB — LIPASE, BLOOD: Lipase: 27 U/L (ref 11–51)

## 2023-10-20 LAB — D-DIMER, QUANTITATIVE: D-Dimer, Quant: 0.64 ug{FEU}/mL — ABNORMAL HIGH (ref 0.00–0.50)

## 2023-10-20 MED ORDER — HEPARIN BOLUS VIA INFUSION
4000.0000 [IU] | Freq: Once | INTRAVENOUS | Status: AC
  Administered 2023-10-20 (×2): 4000 [IU] via INTRAVENOUS
  Filled 2023-10-20: qty 4000

## 2023-10-20 MED ORDER — FAMOTIDINE IN NACL 20-0.9 MG/50ML-% IV SOLN
20.0000 mg | Freq: Once | INTRAVENOUS | Status: AC
  Administered 2023-10-20 (×2): 20 mg via INTRAVENOUS
  Filled 2023-10-20: qty 50

## 2023-10-20 MED ORDER — SENNOSIDES-DOCUSATE SODIUM 8.6-50 MG PO TABS: 1.0000 | ORAL_TABLET | Freq: Every evening | ORAL | Status: DC | PRN

## 2023-10-20 MED ORDER — HEPARIN (PORCINE) 25000 UT/250ML-% IV SOLN
1400.0000 [IU]/h | INTRAVENOUS | Status: AC
  Administered 2023-10-20 (×2): 1000 [IU]/h via INTRAVENOUS
  Administered 2023-10-21 (×2): 1200 [IU]/h via INTRAVENOUS
  Administered 2023-10-22 (×2): 1400 [IU]/h via INTRAVENOUS
  Filled 2023-10-20 (×3): qty 250

## 2023-10-20 MED ORDER — KETOROLAC TROMETHAMINE 15 MG/ML IJ SOLN
15.0000 mg | Freq: Once | INTRAMUSCULAR | Status: AC
  Administered 2023-10-20 (×2): 15 mg via INTRAVENOUS
  Filled 2023-10-20: qty 1

## 2023-10-20 MED ORDER — ONDANSETRON HCL 4 MG/2ML IJ SOLN: 4.0000 mg | Freq: Four times a day (QID) | INTRAMUSCULAR | Status: DC | PRN

## 2023-10-20 MED ORDER — ACETAMINOPHEN 325 MG PO TABS
650.0000 mg | ORAL_TABLET | Freq: Four times a day (QID) | ORAL | Status: DC | PRN
  Administered 2023-10-21 (×2): 650 mg via ORAL
  Filled 2023-10-20: qty 2

## 2023-10-20 MED ORDER — DIPHENHYDRAMINE HCL 25 MG PO CAPS
50.0000 mg | ORAL_CAPSULE | Freq: Four times a day (QID) | ORAL | Status: DC | PRN
  Administered 2023-10-20 (×2): 50 mg via ORAL
  Filled 2023-10-20: qty 2

## 2023-10-20 MED ORDER — HYDROCODONE-ACETAMINOPHEN 5-325 MG PO TABS
1.0000 | ORAL_TABLET | Freq: Four times a day (QID) | ORAL | Status: DC | PRN
  Administered 2023-10-21 (×4): 1 via ORAL
  Filled 2023-10-20 (×2): qty 1

## 2023-10-20 MED ORDER — ONDANSETRON HCL 4 MG PO TABS: 4.0000 mg | ORAL_TABLET | Freq: Four times a day (QID) | ORAL | Status: DC | PRN

## 2023-10-20 MED ORDER — ACETAMINOPHEN 650 MG RE SUPP: 650.0000 mg | Freq: Four times a day (QID) | RECTAL | Status: DC | PRN

## 2023-10-20 MED ORDER — IOHEXOL 350 MG/ML SOLN
75.0000 mL | Freq: Once | INTRAVENOUS | Status: AC | PRN
  Administered 2023-10-20 (×2): 75 mL via INTRAVENOUS

## 2023-10-20 MED ORDER — OMEPRAZOLE MAGNESIUM 20 MG PO TBEC: 20.0000 mg | DELAYED_RELEASE_TABLET | Freq: Every day | ORAL | 1 refills | Status: AC

## 2023-10-20 NOTE — H&P (Signed)
 History and Physical    Jason Wise FMW:[id number] DOB: 09/29/1995 DOA: 10/20/2023  PCP: Pcp, No (Confirm with patient/family/NH records and if not entered, this has to be entered at [facility] point of entry) Patient coming from: Home  I have personally briefly reviewed patient's old medical records in [facility]  Chief Complaint: Chest pain  HPI: Jason Wise is a 28 y.o. male with no significant past medical he presented with worsening of chest pains.  Symptoms started about 2 months ago, when patient started to have intermittent sharp like chest pain, mostly located on the left side rib cage, worsening with cough and deep breath, denied any fever or chills no weight loss.  Came in 4 weeks ago to ED, and workup including EKG and troponin study were negative and patient sent home with as needed over-the-counter pain meds.  Patient however continued to experience chest pains and decided to come back today.  His father died of massive heart attack in the age of 14s, otherwise no known history of coagulation problems in the family.  ED Course: Afebrile, pulse 100 blood pressure 132/77 O2 saturation 100 % on room air. CTA showed bilateral PE but no right heart strain, troponin negative, hemoglobin 16 WBC 10.4.  Patient started on heparin  drip in the ED  Review of Systems: As per HPI otherwise 14 point review of systems negative.    Past Medical History:  Diagnosis Date   Bipolar disorder (HCC)    Panic attack     History reviewed. No pertinent surgical history.   reports that he has been smoking cigarettes. He has never used smokeless tobacco. He reports that he does not drink alcohol and does not use drugs.  No Known Allergies  History reviewed. No pertinent family history.   Prior to Admission medications   Medication Sig Start Date End Date Taking? Authorizing Provider  omeprazole  (PRILOSEC  OTC) 20 MG tablet Take 1 tablet (20 mg total) by mouth daily. 10/20/23 12/19/23 Yes  Mumma, Clotilda, MD  FLUoxetine  (PROZAC ) 10 MG capsule Take 1 capsule (10 mg total) by mouth at bedtime. Patient not taking: Reported on 06/24/2018 05/04/16   Yvonna Alm Cough, MD  gabapentin  (NEURONTIN ) 300 MG capsule Take 1 capsule (300 mg total) by mouth 3 (three) times daily as needed (sciatica pain). 12/30/22 12/30/23  Floy Roberts, MD  HYDROcodone -acetaminophen  (NORCO/VICODIN) 5-325 MG tablet Take 1 tablet by mouth every 6 (six) hours as needed for severe pain (pain score 7-10). 03/24/23   Idol, Julie, PA-C  hydrOXYzine  (ATARAX /VISTARIL ) 25 MG tablet Take 1 tablet (25 mg total) by mouth 3 (three) times daily as needed. Patient not taking: Reported on 06/24/2018 09/09/16   Alona Knee, PA-C  ibuprofen  (ADVIL ) 600 MG tablet Take 1 tablet (600 mg total) by mouth every 6 (six) hours as needed. 03/24/23   Idol, Julie, PA-C  OLANZapine  (ZYPREXA ) 2.5 MG tablet Take 0.5 tablets (1.25 mg total) by mouth at bedtime. 05/04/16 05/04/17  Yvonna Alm Cough, MD  predniSONE  (STERAPRED UNI-PAK 21 TAB) 10 MG (21) TBPK tablet Take 6 pills on day one then decrease by 1 pill each day Patient not taking: Reported on 06/24/2018 05/12/18   Gasper Devere ORN, PA-C    Physical Exam: Vitals:   10/20/23 1254 10/20/23 1350 10/20/23 1653 10/20/23 1730  BP: 135/83   132/77  Pulse: (!) 114   (!) 104  Resp: 17   15  Temp: 98.2 F (36.8 C)   98.2 F (36.8 C)  TempSrc:  Oral   Oral  SpO2: 99% 100%  100%  Weight:   59.4 kg     Constitutional: NAD, calm, comfortable Vitals:   10/20/23 1254 10/20/23 1350 10/20/23 1653 10/20/23 1730  BP: 135/83   132/77  Pulse: (!) 114   (!) 104  Resp: 17   15  Temp: 98.2 F (36.8 C)   98.2 F (36.8 C)  TempSrc: Oral   Oral  SpO2: 99% 100%  100%  Weight:   59.4 kg    Eyes: PERRL, lids and conjunctivae normal ENMT: Mucous membranes are moist. Posterior pharynx clear of any exudate or lesions.Normal dentition.  Neck: normal, supple, no masses, no  thyromegaly Respiratory: clear to auscultation bilaterally, no wheezing, no crackles. Normal respiratory effort. No accessory muscle use.  Cardiovascular: Regular rate and rhythm, no murmurs / rubs / gallops. No extremity edema. 2+ pedal pulses. No carotid bruits.  Abdomen: no tenderness, no masses palpated. No hepatosplenomegaly. Bowel sounds positive.  Musculoskeletal: no clubbing / cyanosis. No joint deformity upper and lower extremities. Good ROM, no contractures. Normal muscle tone.  Skin: no rashes, lesions, ulcers. No induration Neurologic: CN 2-12 grossly intact. Sensation intact, DTR normal. Strength 5/5 in all 4.  Psychiatric: Normal judgment and insight. Alert and oriented x 3. Normal mood.     Labs on Admission: I have personally reviewed following labs and imaging studies  CBC: Recent Labs  Lab 10/20/23 1302  WBC 10.4  HGB 16.0  HCT 47.3  MCV 86.0  PLT 282   Basic Metabolic Panel: Recent Labs  Lab 10/20/23 1302  NA 143  K 3.8  CL 104  CO2 28  GLUCOSE 86  BUN 7  CREATININE 0.79  CALCIUM 9.6   GFR: Estimated Creatinine Clearance: 117.6 mL/min (by C-G formula based on SCr of 0.79 mg/dL). Liver Function Tests: Recent Labs  Lab 10/20/23 1302  AST 22  ALT 16  ALKPHOS 49  BILITOT 1.1  PROT 7.5  ALBUMIN 4.4   Recent Labs  Lab 10/20/23 1302  LIPASE 27   No results for input(s): AMMONIA in the last 168 hours. Coagulation Profile: Recent Labs  Lab 10/20/23 1729  INR 1.2   Cardiac Enzymes: No results for input(s): CKTOTAL, CKMB, CKMBINDEX, TROPONINI in the last 168 hours. BNP (last 3 results) No results for input(s): PROBNP in the last 8760 hours. HbA1C: No results for input(s): HGBA1C in the last 72 hours. CBG: No results for input(s): GLUCAP in the last 168 hours. Lipid Profile: No results for input(s): CHOL, HDL, LDLCALC, TRIG, CHOLHDL, LDLDIRECT in the last 72 hours. Thyroid  Function Tests: No results for  input(s): TSH, T4TOTAL, FREET4, T3FREE, THYROIDAB in the last 72 hours. Anemia Panel: No results for input(s): VITAMINB12, FOLATE, FERRITIN, TIBC, IRON, RETICCTPCT in the last 72 hours. Urine analysis:    Component Value Date/Time   COLORURINE YELLOW (A) 05/04/2016 1039   APPEARANCEUR CLEAR (A) 05/04/2016 1039   LABSPEC 1.021 05/04/2016 1039   PHURINE 6.0 05/04/2016 1039   GLUCOSEU NEGATIVE 05/04/2016 1039   HGBUR NEGATIVE 05/04/2016 1039   BILIRUBINUR NEGATIVE 05/04/2016 1039   KETONESUR NEGATIVE 05/04/2016 1039   PROTEINUR NEGATIVE 05/04/2016 1039   NITRITE NEGATIVE 05/04/2016 1039   LEUKOCYTESUR NEGATIVE 05/04/2016 1039    Radiological Exams on Admission: CT Angio Chest PE W/Cm &/Or Wo Cm Result Date: 10/20/2023 CLINICAL DATA:  Pulmonary embolism (PE) suspected, low to intermediate prob, positive D-dimer EXAM: CT ANGIOGRAPHY CHEST WITH CONTRAST TECHNIQUE: Multidetector CT imaging of the chest was performed  using the standard protocol during bolus administration of intravenous contrast. Multiplanar CT image reconstructions and MIPs were obtained to evaluate the vascular anatomy. RADIATION DOSE REDUCTION: This exam was performed according to the departmental dose-optimization program which includes automated exposure control, adjustment of the mA and/or kV according to patient size and/or use of iterative reconstruction technique. CONTRAST:  75mL OMNIPAQUE  IOHEXOL  350 MG/ML SOLN COMPARISON:  None Available. FINDINGS: Pulmonary Embolism: Occlusive and nonocclusive emboli within the segmental branches of the right upper lobe. Distal subsegmental embolus within the medial basal right lower lobe. Occlusive segmental and subsegmental emboli within the basilar segments of the left lower lobe with a proximal segmental embolus in the superior segment of the left lower lobe. No findings of right heart strain. Cardiovascular: No cardiomegaly or pericardial effusion.No aortic  aneurysm. Mediastinum/Nodes: No mediastinal mass. Small amount of nonexpansile soft tissue in the anterior mediastinum, likely residual thymic tissue No mediastinal, hilar, or axillary lymphadenopathy. Lungs/Pleura: The midline trachea and bronchi are patent. No focal airspace consolidation, pleural effusion, or pneumothorax. Musculoskeletal: No acute fracture or destructive bone lesion. Upper Abdomen: No acute abnormality in the partially visualized upper abdomen. Review of the MIP images confirms the above findings. IMPRESSION: 1. Moderate volume segmental and subsegmental pulmonary emboli scattered throughout both lungs, as delineated above. No findings of right heart strain. 2. No pneumonia, pulmonary edema, or pleural effusion. Critical Value/emergent results were called by telephone at the time of interpretation on 10/20/2023 at 4:42 pm to provider Dr Dorothyann, who verbally acknowledged these results. Electronically Signed   By: Rogelia Myers M.D.   On: 10/20/2023 16:44   DG Chest 2 View Result Date: 10/20/2023 CLINICAL DATA:  Chest pain EXAM: CHEST - 2 VIEW COMPARISON:  09/29/2023 FINDINGS: The heart size and mediastinal contours are within normal limits. Both lungs are clear. The visualized skeletal structures are unremarkable. No pneumothorax. IMPRESSION: No active cardiopulmonary disease. Electronically Signed   By: Franky Crease M.D.   On: 10/20/2023 14:06    EKG: Independently reviewed.  Sinus tachycardia, no acute ST changes.  Assessment/Plan Principal Problem:   Pulmonary emboli (HCC) Active Problems:   Acute pulmonary embolism (HCC)  (please populate well all problems here in Problem List. (For example, if patient is on BP meds at home and you resume or decide to hold them, it is a problem that needs to be her. Same for CAD, COPD, HLD and so on)  Bilateral PE, unprovoked Chest pain - Continue heparin  drip, likely can switch to Eliquis  on discharge home tomorrow - Echocardiogram  and DVT study -No signs of lung infarction on CTA, oxycodone  as needed for pain control - Outpatient follow-up with hematology for hypercoagulable state workup, follow-up information given in discharge section.  Nicotine  dependence - Cessation education performed at bedside  Total time spent on patient care 55 minutes  DVT prophylaxis: Heparin  drip Code Status: Full code Family Communication: None at bedside Disposition Plan: Expect less than 2 midnight hospital stay Consults called: None Admission status: Telemetry observation   Cort ONEIDA Mana MD [facility] Pager [phone]  10/20/2023, 6:28 PM

## 2023-10-20 NOTE — ED Notes (Signed)
 Pt given dinner tray.

## 2023-10-20 NOTE — Discharge Instructions (Addendum)
 You were seen in the emergency department for chest pain.  It is important that you try to stop smoking nicotine  and decrease your alcohol use.  You were started on an acid reducing medication.  You are given information to follow-up as an outpatient with primary care provider.  Return to the emergency department for any return or worsening symptoms.

## 2023-10-20 NOTE — ED Notes (Addendum)
 Called CCMD and added pt to monitoring board

## 2023-10-20 NOTE — ED Triage Notes (Signed)
 Pt c/o central CP that radiates to the left and is worse w/ deep breaths. Pt c/o confusion and SHOB today as well. Pt has been working outside, states he has been here for similar s/s and is awaiting an appointment w/ cardiology. Pt AOX4, NAD noted. Skin is warm dry and pink.

## 2023-10-20 NOTE — ED Provider Notes (Signed)
 [facility] Provider Note    Event Date/Time   First MD Initiated Contact with Patient 10/20/23 1353     (approximate)   History   Chest Pain   HPI  Jason Wise is a 28 y.o. male past medical history significant for nicotine  use, alcohol abuse, who presents to the emergency department with chest pain.  Patient endorses ongoing episodes of chest pain that is in the middle of his chest and radiates to his left neck.  States he felt mildly confused today and just felt off.  States that the pain was severe so he left work.  Endorses nicotine  use on a daily basis.  States that he drinks a bottle of alcohol on a daily basis.  Last drink of last night.  Denies withdrawing from alcohol in the past.  States that he has called cardiology for follow-up but has not yet followed up with cardiology.  Does have family history in his father of coronary artery disease at a young age.  No history of DVT or PE.  No recent falls or trauma.  Denies any cocaine use.     Physical Exam   Triage Vital Signs: ED Triage Vitals [10/20/23 1254]  Encounter Vitals Group     BP 135/83     Girls Systolic BP Percentile      Girls Diastolic BP Percentile      Boys Systolic BP Percentile      Boys Diastolic BP Percentile      Pulse Rate (!) 114     Resp 17     Temp 98.2 F (36.8 C)     Temp Source Oral     SpO2 99 %     Weight      Height      Head Circumference      Peak Flow      Pain Score 9     Pain Loc      Pain Education      Exclude from Growth Chart     Most recent vital signs: Vitals:   10/20/23 1254 10/20/23 1350  BP: 135/83   Pulse: (!) 114   Resp: 17   Temp: 98.2 F (36.8 C)   SpO2: 99% 100%    Physical Exam Constitutional:      Appearance: He is well-developed.  HENT:     Head: Atraumatic.  Eyes:     Conjunctiva/sclera: Conjunctivae normal.  Cardiovascular:     Rate and Rhythm: Regular rhythm. Tachycardia present.     Pulses:          Radial  pulses are 2+ on the right side and 2+ on the left side.       Dorsalis pedis pulses are 2+ on the right side and 2+ on the left side.  Pulmonary:     Effort: No tachypnea or respiratory distress.  Abdominal:     Tenderness: There is abdominal tenderness.     Comments: Mild epigastric abdominal tenderness to palpation with no rebound or guarding.  Negative McBurney's point, negative Murphy sign   Musculoskeletal:     Cervical back: Normal range of motion.  Skin:    General: Skin is warm.     Capillary Refill: Capillary refill takes less than 2 seconds.  Neurological:     General: No focal deficit present.     Mental Status: He is alert. Mental status is at baseline.     IMPRESSION / MDM / ASSESSMENT AND PLAN /  ED COURSE  I reviewed the triage vital signs and the nursing notes.  Differential diagnosis including ACS, anemia, pneumonia, pneumothorax, gastritis/PUD, pulmonary embolism.  Patient complained of some confusion but he is alert and oriented a, answering all questions appropriately and has a nonfocal neurologic exam.  Low risk Wells criteria, was tachycardic on arrival, will use a screening D-dimer test to further risk ratified for pulmonary embolism.   RADIOLOGY I independently reviewed imaging, my interpretation of imaging: Chest x-ray with no signs of pneumonia.  Read as no acute findings.  LABS (all labs ordered are listed, but only abnormal results are displayed) Labs interpreted as -    Labs Reviewed  D-DIMER, QUANTITATIVE - Abnormal; Notable for the following components:      Result Value   D-Dimer, Quant 0.64 (*)    All other components within normal limits  BASIC METABOLIC PANEL WITH GFR  CBC  HEPATIC FUNCTION PANEL  LIPASE, BLOOD  TROPONIN I (HIGH SENSITIVITY)  TROPONIN I (HIGH SENSITIVITY)     MDM    D-dimer is positive.  Will obtain a CTA to further evaluate and or stratify for pulmonary embolism.  No significant anemia or leukocytosis.   Creatinine appears to be at his baseline.  No significant electrolyte abnormality.  Troponin is negative at 3.  Low suspicion for ACS, symptoms of chest pain has been ongoing for the past couple of weeks.  Serial troponins are negative.  Normal LFTs and lipase.  Given IV ketorolac  and IV Pepcid  for pain control.  CT is currently pending at time of signout.  If negative will discharge home with outpatient follow-up.  Discussed nicotine  and alcohol cessation.  Will start the patient on a PPI.   PROCEDURES:  Critical Care performed: No  Procedures  Patient's presentation is most consistent with acute presentation with potential threat to life or bodily function.   MEDICATIONS ORDERED IN ED: Medications  iohexol  (OMNIPAQUE ) 350 MG/ML injection 75 mL (has no administration in time range)  ketorolac  (TORADOL ) 15 MG/ML injection 15 mg (15 mg Intravenous Given 10/20/23 1512)  famotidine  (PEPCID ) IVPB 20 mg premix (0 mg Intravenous Stopped 10/20/23 1559)    FINAL CLINICAL IMPRESSION(S) / ED DIAGNOSES   Final diagnoses:  Chest pain, unspecified type     Rx / DC Orders   ED Discharge Orders          Ordered    omeprazole  (PRILOSEC  OTC) 20 MG tablet  Daily        10/20/23 1601             Note:  This document was prepared using Dragon voice recognition software and may include unintentional dictation errors.   Suzanne Kirsch, MD 10/20/23 [phone]

## 2023-10-20 NOTE — ED Provider Notes (Signed)
-----------------------------------------   4:48 PM on 10/20/2023 -----------------------------------------  Patient CTA shows moderate volume PE throughout both lungs.  No right heart strain.  I have ordered a heparin  infusion for the patient I have updated the patient on findings we will admit to the hospital service for further workup and treatment.  CRITICAL CARE Performed by: Franky Moores   Total critical care time: 30 minutes  Critical care time was exclusive of separately billable procedures and treating other patients.  Critical care was necessary to treat or prevent imminent or life-threatening deterioration.  Critical care was time spent personally by me on the following activities: development of treatment plan with patient and/or surrogate as well as nursing, discussions with consultants, evaluation of patient's response to treatment, examination of patient, obtaining history from patient or surrogate, ordering and performing treatments and interventions, ordering and review of laboratory studies, ordering and review of radiographic studies, pulse oximetry and re-evaluation of patient's condition.    Moores Franky, MD 10/20/23 1649

## 2023-10-20 NOTE — ED Notes (Signed)
 Pt was asked if he could ambulate to the evaluation area to which he stated he could not walk due to weakness. Pt able to stand and sit in recliner in hall.   Pt complains of crushing chest pain on the left side accompanied with SOB that started when he woke up at 0700 this morning. Pt reports hx of same and has a cardiology appointment next month.

## 2023-10-20 NOTE — Consult Note (Signed)
 PHARMACY - ANTICOAGULATION CONSULT NOTE  Pharmacy Consult for IV Heparin  Indication: pulmonary embolus  Patient Measurements: Weight: 59.4 kg (131 lb)  Labs: Recent Labs    10/20/23 1302 10/20/23 1435  HGB 16.0  --   HCT 47.3  --   PLT 282  --   CREATININE 0.79  --   TROPONINIHS 3 3    CrCl cannot be calculated (Unknown ideal weight.).   Medical History: Past Medical History:  Diagnosis Date   Bipolar disorder (HCC)    Panic attack     Medications:  No anticoagulation prior to admission per my chart review  Assessment: Patient with medical history as above presenting with acute PE. Pharmacy consulted to manage heparin  infusion.  Baseline aPTT, INR are pending. CBC normal.  Goal of Therapy:  Heparin  level 0.3-0.7 units/ml Monitor platelets by anticoagulation protocol: Yes   Plan:  --Heparin  4000 unit IV bolus followed by continuous infusion at 1000 units/hr --Heparin  level 6 hours from initiation --Daily CBC per protocol while on IV heparin   Marolyn KATHEE Mare 10/20/2023,4:49 PM

## 2023-10-21 ENCOUNTER — Inpatient Hospital Stay
Admit: 2023-10-21 | Discharge: 2023-10-21 | Disposition: A | Discharge status: Home / Self Care | Payer: Self-pay | Attending: Internal Medicine | Admitting: Internal Medicine

## 2023-10-21 ENCOUNTER — Other Ambulatory Visit (HOSPITAL_COMMUNITY): Payer: Self-pay

## 2023-10-21 DIAGNOSIS — I2699 Other pulmonary embolism without acute cor pulmonale: Secondary | ICD-10-CM | POA: Diagnosis present

## 2023-10-21 DIAGNOSIS — R079 Chest pain, unspecified: Secondary | ICD-10-CM

## 2023-10-21 LAB — HEPARIN LEVEL (UNFRACTIONATED)
Heparin Unfractionated: 0.3 [IU]/mL (ref 0.30–0.70)
Heparin Unfractionated: 0.26 [IU]/mL — ABNORMAL LOW (ref 0.30–0.70)
Heparin Unfractionated: 0.23 [IU]/mL — ABNORMAL LOW (ref 0.30–0.70)
Heparin Unfractionated: 0.49 [IU]/mL (ref 0.30–0.70)

## 2023-10-21 LAB — CBC
HCT: 45.7 % (ref 39.0–52.0)
Hemoglobin: 14.9 g/dL (ref 13.0–17.0)
MCH: 29 pg (ref 26.0–34.0)
MCHC: 32.6 g/dL (ref 30.0–36.0)
MCV: 88.9 fL (ref 80.0–100.0)
Platelets: 251 K/uL (ref 150–400)
RBC: 5.14 MIL/uL (ref 4.22–5.81)
RDW: 13.1 % (ref 11.5–15.5)
WBC: 7.8 K/uL (ref 4.0–10.5)
nRBC: 0 % (ref 0.0–0.2)

## 2023-10-21 LAB — HIV ANTIBODY (ROUTINE TESTING W REFLEX): HIV Screen 4th Generation wRfx: NONREACTIVE

## 2023-10-21 MED ORDER — HEPARIN BOLUS VIA INFUSION
900.0000 [IU] | Freq: Once | INTRAVENOUS | Status: AC
  Administered 2023-10-21 (×2): 900 [IU] via INTRAVENOUS
  Filled 2023-10-21: qty 900

## 2023-10-21 MED ORDER — LORAZEPAM 2 MG PO TABS: 2.0000 mg | ORAL_TABLET | Freq: Four times a day (QID) | ORAL | Status: DC | PRN

## 2023-10-21 MED ORDER — HYDROCORTISONE 1 % EX CREA
TOPICAL_CREAM | Freq: Two times a day (BID) | CUTANEOUS | Status: DC
  Filled 2023-10-21 (×2): qty 28

## 2023-10-21 MED ORDER — MORPHINE SULFATE (PF) 2 MG/ML IV SOLN
2.0000 mg | Freq: Four times a day (QID) | INTRAVENOUS | Status: DC | PRN
  Filled 2023-10-21: qty 1

## 2023-10-21 NOTE — Consult Note (Signed)
 PHARMACY - ANTICOAGULATION CONSULT NOTE  Pharmacy Consult for Heparin  infusion Indication: pulmonary embolus  Patient Measurements: Weight: 59.4 kg (131 lb)  Labs: Recent Labs    10/20/23 1302 10/20/23 1435 10/20/23 1729 10/21/23 0045 10/21/23 0645 10/21/23 0648 10/21/23 1428 10/21/23 2027  HGB 16.0  --   --   --  14.9  --   --   --   HCT 47.3  --   --   --  45.7  --   --   --   PLT 282  --   --   --  251  --   --   --   APTT  --   --  117*  --   --   --   --   --   LABPROT  --   --  15.5*  --   --   --   --   --   INR  --   --  1.2  --   --   --   --   --   HEPARINUNFRC  --   --   --    < >  --  0.26* 0.23* 0.49  CREATININE 0.79  --   --   --   --   --   --   --   TROPONINIHS 3 3  --   --   --   --   --   --    < > = values in this interval not displayed.    Estimated Creatinine Clearance: 117.6 mL/min (by C-G formula based on SCr of 0.79 mg/dL).   Medical History: Past Medical History:  Diagnosis Date   Bipolar disorder (HCC)    Panic attack    Medications:  No anticoagulation prior to admission per my chart review  Assessment: Patient with no significant medical history presents to ED with worsening chest pain. CT shows bilateral PE without right heart strain. Pharmacy consulted to initiate and manage heparin  infusion.  Date Time Results Comments 8/12 0045 HL=0.30 Therapeutic x 1 at  1000 units/hr 8/12 0648 HL=0.26 Subtherapeutic 8/12 1428 HL=0.23 Subtherapeutic at 1200 units/hour 8/12 2027 HL=0.49 Therapeutic x 1  Goal of Therapy:  Heparin  level 0.3-0.7 units/ml Monitor platelets by anticoagulation protocol: Yes   Plan:  Continue heparin  infusion at 1400 units/hr Next HL and CBC tomorrow AM  Marolyn KATHEE Mare 10/21/2023 9:36 PM

## 2023-10-21 NOTE — Progress Notes (Signed)
 [facility] Progress Note  Patient: Jason Wise    FMW:[id number]  DOA: 10/20/2023     Date of Service: the patient was seen and examined on 10/21/2023  Chief Complaint  Patient presents with   Chest Pain   Brief hospital course: Jason Wise is a 28 y.o. male with no significant past medical he presented with worsening of chest pains.   Symptoms started about 2 months ago, when patient started to have intermittent sharp like chest pain, mostly located on the left side rib cage, worsening with cough and deep breath, denied any fever or chills no weight loss.  Came in 4 weeks ago to ED, and workup including EKG and troponin study were negative and patient sent home with as needed over-the-counter pain meds.  Patient however continued to experience chest pains and decided to come back today.  His father died of massive heart attack in the age of 26s, otherwise no known history of coagulation problems in the family.   ED Course: Afebrile, pulse 100 blood pressure 132/77 O2 saturation 100 % on room air. CTA showed bilateral PE but no right heart strain, troponin negative, hemoglobin 16 WBC 10.4.   Patient started on heparin  drip in the ED   Assessment and Plan:  Bilateral PE, unprovoked Chest pain - Continue heparin  drip, likely can switch to Eliquis  on discharge home tomorrow - Echocardiogram and  Venous duplex negative for DVT  -No signs of lung infarction on CTA, oxycodone  as needed for pain control - Outpatient follow-up with hematology for hypercoagulable state workup, follow-up information given in discharge section. 8/12 vascular surgery consulted for further evaluation    Nicotine  dependence - Cessation education performed at bedside     Body mass index is 19.35 kg/m.  Interventions:  Diet: Regular diet DVT Prophylaxis: Heparin  IV infusion  Advance goals of care discussion: Full code  Family Communication: family was present at bedside, at the time of  interview.  The pt provided permission to discuss medical plan with the family. Opportunity was given to ask question and all questions were answered satisfactorily.   Disposition:  Pt is from Home, admitted with pulmonary embolism, still has chest pain and on IV heparin  infusion, which precludes a safe discharge. Discharge to home, when stable, most likely in 1 to 2 days.  Subjective: No significant events overnight, patient was feeling bilateral chest pain in the morning time which gradually resolved.  Denied any other complaints.  Physical Exam: General: NAD, lying comfortably Appear in no distress, affect appropriate Eyes: PERRLA ENT: Oral Mucosa Clear, moist  Neck: no JVD,  Cardiovascular: S1 and S2 Present, no Murmur,  Respiratory: good respiratory effort, Bilateral Air entry equal and Decreased, no Crackles, no wheezes Abdomen: Bowel Sound present, Soft and no tenderness,  Skin: no rashes Extremities: no Pedal edema, no calf tenderness Neurologic: without any new focal findings Gait not checked due to patient safety concerns  Vitals:   10/21/23 0749 10/21/23 1057 10/21/23 1110 10/21/23 1616  BP: 120/75 125/79 130/77 113/80  Pulse: 64 70 64 79  Resp: 18 19 14 15   Temp: 98.1 F (36.7 C) 97.9 F (36.6 C) 98.4 F (36.9 C) 98 F (36.7 C)  TempSrc:   Oral   SpO2: 97% 98% 97% 98%  Weight:        Intake/Output Summary (Last 24 hours) at 10/21/2023 1721 Last data filed at 10/21/2023 1335 Gross per 24 hour  Intake 306.64 ml  Output --  Net 306.64 ml  Filed Weights   10/20/23 1653  Weight: 59.4 kg    Data Reviewed: I have personally reviewed and interpreted daily labs, tele strips, imagings as discussed above. I reviewed all nursing notes, pharmacy notes, vitals, pertinent old records I have discussed plan of care as described above with RN and patient/family.  CBC: Recent Labs  Lab 10/20/23 1302 10/21/23 0645  WBC 10.4 7.8  HGB 16.0 14.9  HCT 47.3 45.7  MCV  86.0 88.9  PLT 282 251   Basic Metabolic Panel: Recent Labs  Lab 10/20/23 1302  NA 143  K 3.8  CL 104  CO2 28  GLUCOSE 86  BUN 7  CREATININE 0.79  CALCIUM 9.6    Studies: US  Venous Img Lower Bilateral (DVT) Result Date: 10/20/2023 CLINICAL DATA:  [id number] PE (pulmonary thromboembolism) (HCC) [id number] EXAM: BILATERAL LOWER EXTREMITY VENOUS DOPPLER ULTRASOUND TECHNIQUE: Gray-scale sonography with graded compression, as well as color Doppler and duplex ultrasound were performed to evaluate the lower extremity deep venous systems from the level of the common femoral vein and including the common femoral, femoral, profunda femoral, popliteal and calf veins including the posterior tibial, peroneal and gastrocnemius veins when visible. The superficial great saphenous vein was also interrogated. Spectral Doppler was utilized to evaluate flow at rest and with distal augmentation maneuvers in the common femoral, femoral and popliteal veins. COMPARISON:  None Available. FINDINGS: RIGHT LOWER EXTREMITY Common Femoral Vein: No evidence of thrombus. Normal compressibility, respiratory phasicity and response to augmentation. Saphenofemoral Junction: No evidence of thrombus. Normal compressibility and flow on color Doppler imaging. Profunda Femoral Vein: No evidence of thrombus. Normal compressibility and flow on color Doppler imaging. Femoral Vein: No evidence of thrombus. Normal compressibility, respiratory phasicity and response to augmentation. Popliteal Vein: No evidence of thrombus. Normal compressibility, respiratory phasicity and response to augmentation. Calf Veins: No evidence of thrombus. Normal compressibility and flow on color Doppler imaging. Superficial Great Saphenous Vein: No evidence of thrombus. Normal compressibility. Other Findings:  None. LEFT LOWER EXTREMITY Common Femoral Vein: No evidence of thrombus. Normal compressibility, respiratory phasicity and response to augmentation. Saphenofemoral  Junction: No evidence of thrombus. Normal compressibility and flow on color Doppler imaging. Profunda Femoral Vein: No evidence of thrombus. Normal compressibility and flow on color Doppler imaging. Femoral Vein: No evidence of thrombus. Normal compressibility, respiratory phasicity and response to augmentation. Popliteal Vein: No evidence of thrombus. Normal compressibility, respiratory phasicity and response to augmentation. Calf Veins: No evidence of thrombus. Normal compressibility and flow on color Doppler imaging. Superficial Great Saphenous Vein: No evidence of thrombus. Normal compressibility. Other Findings:  None. IMPRESSION: Negative for deep venous thrombosis within both legs. Electronically Signed   By: Rogelia Myers M.D.   On: 10/20/2023 19:13    Scheduled Meds:  hydrocortisone  cream   Topical BID   Continuous Infusions:  heparin  1,400 Units/hr (10/21/23 1518)   PRN Meds: acetaminophen  **OR** acetaminophen , diphenhydrAMINE , HYDROcodone -acetaminophen , LORazepam , morphine  injection, ondansetron  **OR** ondansetron  (ZOFRAN ) IV, senna-docusate  Time spent: 55 minutes  Author: ELVAN SOR. MD [facility] 10/21/2023 5:21 PM  To reach On-call, see care teams to locate the attending and reach out to them via [email or web address]. If 7PM-7AM, please contact night-coverage If you still have difficulty reaching the attending provider, please page the [facility] (Director on Call) for [facility] on amion for assistance.

## 2023-10-21 NOTE — Consult Note (Signed)
 PHARMACY - ANTICOAGULATION CONSULT NOTE  Pharmacy Consult for Heparin  infusion Indication: pulmonary embolus  Patient Measurements: Weight: 59.4 kg (131 lb)  Labs: Recent Labs    10/20/23 1302 10/20/23 1435 10/20/23 1729 10/21/23 0045 10/21/23 0645 10/21/23 0648  HGB 16.0  --   --   --  14.9  --   HCT 47.3  --   --   --  45.7  --   PLT 282  --   --   --  251  --   APTT  --   --  117*  --   --   --   LABPROT  --   --  15.5*  --   --   --   INR  --   --  1.2  --   --   --   HEPARINUNFRC  --   --   --  0.30  --  0.26*  CREATININE 0.79  --   --   --   --   --   TROPONINIHS 3 3  --   --   --   --     Estimated Creatinine Clearance: 117.6 mL/min (by C-G formula based on SCr of 0.79 mg/dL).   Medical History: Past Medical History:  Diagnosis Date   Bipolar disorder (HCC)    Panic attack    Medications:  No anticoagulation prior to admission per my chart review  Assessment: Patient with no significant medical history presents to ED with worsening chest pain. CT shows bilateral PE without right heart strain. Pharmacy consulted to initiate and manage heparin  infusion.  Date Time Results Comments 8/12 0045 HL=0.30 Therapeutic x 1 at  1000 units/hr 8/12 0648 HL=0.26 Subtherapeutic  Goal of Therapy:  Heparin  level 0.3-0.7 units/ml Monitor platelets by anticoagulation protocol: Yes   Plan:  HL subtherapeutic, will order Heparin  900 units IV bolus, then increase infusion rate to 1200 units/hr. Check HL 6hrs after rate change. Daily CBC per protocol while on IV heparin .  Hendrick Pavich Rodriguez-Guzman PharmD, BCPS 10/21/2023 9:30 AM

## 2023-10-21 NOTE — Progress Notes (Signed)
 Echocardiogram 2D Echocardiogram has been performed.  Jason Wise 10/21/2023, 4:09 PM

## 2023-10-21 NOTE — Plan of Care (Signed)

## 2023-10-21 NOTE — Consult Note (Signed)
 PHARMACY - ANTICOAGULATION CONSULT NOTE  Pharmacy Consult for Heparin  infusion Indication: pulmonary embolus  Patient Measurements: Weight: 59.4 kg (131 lb)  Labs: Recent Labs    10/20/23 1302 10/20/23 1435 10/20/23 1729 10/21/23 0045 10/21/23 0645 10/21/23 0648 10/21/23 1428  HGB 16.0  --   --   --  14.9  --   --   HCT 47.3  --   --   --  45.7  --   --   PLT 282  --   --   --  251  --   --   APTT  --   --  117*  --   --   --   --   LABPROT  --   --  15.5*  --   --   --   --   INR  --   --  1.2  --   --   --   --   HEPARINUNFRC  --   --   --  0.30  --  0.26* 0.23*  CREATININE 0.79  --   --   --   --   --   --   TROPONINIHS 3 3  --   --   --   --   --     Estimated Creatinine Clearance: 117.6 mL/min (by C-G formula based on SCr of 0.79 mg/dL).   Medical History: Past Medical History:  Diagnosis Date   Bipolar disorder (HCC)    Panic attack    Medications:  No anticoagulation prior to admission per my chart review  Assessment: Patient with no significant medical history presents to ED with worsening chest pain. CT shows bilateral PE without right heart strain. Pharmacy consulted to initiate and manage heparin  infusion.  Date Time Results Comments 8/12 0045 HL=0.30 Therapeutic x 1 at  1000 units/hr 8/12 0648 HL=0.26 Subtherapeutic 8/2 1428 HL=0.23 Subtherapeutic at 1200 units/hour  Goal of Therapy:  Heparin  level 0.3-0.7 units/ml Monitor platelets by anticoagulation protocol: Yes   Plan:  HL subtherapeutic, will order Heparin  900 units IV bolus, then increase infusion rate to 1400 units/hr. Check HL 6hrs after rate change. Daily CBC per protocol while on IV heparin .  Jaiana Sheffer Rodriguez-Guzman PharmD, BCPS 10/21/2023 3:00 PM

## 2023-10-21 NOTE — Consult Note (Signed)
 PHARMACY - ANTICOAGULATION CONSULT NOTE  Pharmacy Consult for IV Heparin  Indication: pulmonary embolus  Patient Measurements: Weight: 59.4 kg (131 lb)  Labs: Recent Labs    10/20/23 1302 10/20/23 1435 10/20/23 1729 10/21/23 0045  HGB 16.0  --   --   --   HCT 47.3  --   --   --   PLT 282  --   --   --   APTT  --   --  117*  --   LABPROT  --   --  15.5*  --   INR  --   --  1.2  --   HEPARINUNFRC  --   --   --  0.30  CREATININE 0.79  --   --   --   TROPONINIHS 3 3  --   --     Estimated Creatinine Clearance: 117.6 mL/min (by C-G formula based on SCr of 0.79 mg/dL).   Medical History: Past Medical History:  Diagnosis Date   Bipolar disorder (HCC)    Panic attack     Medications:  No anticoagulation prior to admission per my chart review  Assessment: Patient with medical history as above presenting with acute PE. Pharmacy consulted to manage heparin  infusion.  Baseline aPTT, INR are pending. CBC normal.  Goal of Therapy:  Heparin  level 0.3-0.7 units/ml Monitor platelets by anticoagulation protocol: Yes   Plan:  8/12:  HL @ 0045 = 0.30, therapeutic X 1 - Will continue pt on current rate and recheck HL in 6 hrs  --Daily CBC per protocol while on IV heparin   Ketrina Boateng D 10/21/2023,1:21 AM

## 2023-10-21 NOTE — Plan of Care (Signed)
  Problem: Education: Goal: Knowledge of General Education information will improve Description: Including pain rating scale, medication(s)/side effects and non-pharmacologic comfort measures 10/21/2023 0312 by Alphonzo Sari SAILOR, RN Outcome: Progressing 10/21/2023 0312 by Alphonzo Sari SAILOR, RN Outcome: Progressing   Problem: Health Behavior/Discharge Planning: Goal: Ability to manage health-related needs will improve 10/21/2023 0312 by Alphonzo Sari SAILOR, RN Outcome: Progressing 10/21/2023 0312 by Alphonzo Sari SAILOR, RN Outcome: Progressing   Problem: Clinical Measurements: Goal: Ability to maintain clinical measurements within normal limits will improve 10/21/2023 0312 by Alphonzo Sari SAILOR, RN Outcome: Progressing 10/21/2023 0312 by Alphonzo Sari SAILOR, RN Outcome: Progressing Goal: Will remain free from infection 10/21/2023 0312 by Alphonzo Sari SAILOR, RN Outcome: Progressing 10/21/2023 0312 by Alphonzo Sari SAILOR, RN Outcome: Progressing Goal: Diagnostic test results will improve 10/21/2023 0312 by Alphonzo Sari SAILOR, RN Outcome: Progressing 10/21/2023 0312 by Alphonzo Sari SAILOR, RN Outcome: Progressing Goal: Respiratory complications will improve 10/21/2023 0312 by Alphonzo Sari SAILOR, RN Outcome: Progressing 10/21/2023 0312 by Alphonzo Sari SAILOR, RN Outcome: Progressing Goal: Cardiovascular complication will be avoided 10/21/2023 0312 by Alphonzo Sari SAILOR, RN Outcome: Progressing 10/21/2023 0312 by Alphonzo Sari SAILOR, RN Outcome: Progressing   Problem: Activity: Goal: Risk for activity intolerance will decrease 10/21/2023 0312 by Alphonzo Sari SAILOR, RN Outcome: Progressing 10/21/2023 0312 by Alphonzo Sari SAILOR, RN Outcome: Progressing   Problem: Nutrition: Goal: Adequate nutrition will be maintained 10/21/2023 0312 by Alphonzo Sari SAILOR, RN Outcome: Progressing 10/21/2023 0312 by Alphonzo Sari SAILOR, RN Outcome: Progressing   Problem: Coping: Goal: Level of anxiety will decrease 10/21/2023 0312 by Alphonzo Sari SAILOR, RN Outcome:  Progressing 10/21/2023 0312 by Alphonzo Sari SAILOR, RN Outcome: Progressing   Problem: Elimination: Goal: Will not experience complications related to bowel motility 10/21/2023 0312 by Alphonzo Sari SAILOR, RN Outcome: Progressing 10/21/2023 0312 by Alphonzo Sari SAILOR, RN Outcome: Progressing Goal: Will not experience complications related to urinary retention 10/21/2023 0312 by Alphonzo Sari SAILOR, RN Outcome: Progressing 10/21/2023 0312 by Alphonzo Sari SAILOR, RN Outcome: Progressing   Problem: Pain Managment: Goal: General experience of comfort will improve and/or be controlled 10/21/2023 0312 by Alphonzo Sari SAILOR, RN Outcome: Progressing 10/21/2023 0312 by Alphonzo Sari SAILOR, RN Outcome: Progressing   Problem: Safety: Goal: Ability to remain free from injury will improve 10/21/2023 0312 by Alphonzo Sari SAILOR, RN Outcome: Progressing 10/21/2023 0312 by Alphonzo Sari SAILOR, RN Outcome: Progressing   Problem: Skin Integrity: Goal: Risk for impaired skin integrity will decrease 10/21/2023 0312 by Alphonzo Sari SAILOR, RN Outcome: Progressing 10/21/2023 0312 by Alphonzo Sari SAILOR, RN Outcome: Progressing

## 2023-10-21 NOTE — Consult Note (Signed)
 Hospital Consult    Reason for Consult:  Bilateral Pulmonary Embolism Requesting Physician:  Dr Elvan Sor MD  MRN #:  [id number]  History of Present Illness: This is a 28 y.o. male with no significant past medical he presented with worsening of chest pains. He has Bipolar disorder with panic attacks.   Patient endorses symptoms started about 2 months ago, when patient started to have intermittent sharp like chest pain, mostly located on the left side rib cage, worsening with cough and deep breath, denied any fever or chills no weight loss. Came in 4 weeks ago to ED, and workup including EKG and troponin study were negative and patient sent home with as needed over-the-counter pain meds. Patient however continued to experience chest pains and decided to come back today. His father died of massive heart attack in the age of 43s, otherwise no known history of coagulation problems in the family.   Upon work up patient underwent CTA of the chest with PE protocol. He was positive for bilateral pulmonary embolisms with moderate clot burden and without right heart strain. EKG is normal sinus rhythm. Patient is on room Air today with Oxygen Saturations of 100%. He does endorse chest pressure on deep breaths. Patient ambulating to the bathroom  without difficulties. Vascular Surgery consulted to evaluate.   Past Medical History:  Diagnosis Date   Bipolar disorder (HCC)    Panic attack     History reviewed. No pertinent surgical history.  No Known Allergies  Prior to Admission medications   Medication Sig Start Date End Date Taking? Authorizing Provider  omeprazole  (PRILOSEC  OTC) 20 MG tablet Take 1 tablet (20 mg total) by mouth daily. 10/20/23 12/19/23 Yes Mumma, Clotilda, MD  gabapentin  (NEURONTIN ) 300 MG capsule Take 1 capsule (300 mg total) by mouth 3 (three) times daily as needed (sciatica pain). Patient not taking: Reported on 10/20/2023 12/30/22 12/30/23  Floy Roberts, MD   HYDROcodone -acetaminophen  (NORCO/VICODIN) 5-325 MG tablet Take 1 tablet by mouth every 6 (six) hours as needed for severe pain (pain score 7-10). Patient not taking: Reported on 10/20/2023 03/24/23   Idol, Julie, PA-C  hydrOXYzine  (ATARAX /VISTARIL ) 25 MG tablet Take 1 tablet (25 mg total) by mouth 3 (three) times daily as needed. Patient not taking: Reported on 06/24/2018 09/09/16   Alona Knee, PA-C  ibuprofen  (ADVIL ) 600 MG tablet Take 1 tablet (600 mg total) by mouth every 6 (six) hours as needed. Patient not taking: Reported on 10/20/2023 03/24/23   Idol, Julie, PA-C  OLANZapine  (ZYPREXA ) 2.5 MG tablet Take 0.5 tablets (1.25 mg total) by mouth at bedtime. 05/04/16 05/04/17  Yvonna Alm Cough, MD  predniSONE  (STERAPRED UNI-PAK 21 TAB) 10 MG (21) TBPK tablet Take 6 pills on day one then decrease by 1 pill each day Patient not taking: Reported on 06/24/2018 05/12/18   Gasper Devere ORN, PA-C    Social History   Socioeconomic History   Marital status: Single    Spouse name: Not on file   Number of children: Not on file   Years of education: Not on file   Highest education level: Not on file  Occupational History   Not on file  Tobacco Use   Smoking status: Every Day    Current packs/day: 1.00    Types: Cigarettes   Smokeless tobacco: Never  Vaping Use   Vaping status: Every Day  Substance and Sexual Activity   Alcohol use: No   Drug use: No   Sexual activity: Yes  Other Topics Concern  Not on file  Social History Narrative   Not on file   Social Drivers of Health   Financial Resource Strain: Not on file  Food Insecurity: No Food Insecurity (10/21/2023)   Hunger Vital Sign    Worried About Running Out of Food in the Last Year: Never true    Ran Out of Food in the Last Year: Never true  Transportation Needs: Not on file  Physical Activity: Not on file  Stress: Not on file  Social Connections: Not on file  Intimate Partner Violence: Not on file     History reviewed. No  pertinent family history.  ROS: Otherwise negative unless mentioned in HPI  Physical Examination  Vitals:   10/21/23 1057 10/21/23 1110  BP: 125/79 130/77  Pulse: 70 64  Resp: 19   Temp: 97.9 F (36.6 C)   SpO2: 98% (!) 70%   Body mass index is 19.35 kg/m.  General:  WDWN in NAD Gait: Not observed HENT: WNL, normocephalic Pulmonary: normal non-labored breathing, without Rales, rhonchi,  wheezing Cardiac: regular, without  Murmurs, rubs or gallops; without carotid bruits Abdomen: Positive bowel sounds throughout, soft, NT/ND, no masses Skin: without rashes Vascular Exam/Pulses: Palpable pulses throughout all extremities are warm to the touch.  Extremities: without ischemic changes, without Gangrene , without cellulitis; without open wounds;  Musculoskeletal: no muscle wasting or atrophy  Neurologic: A&O X 3;  No focal weakness or paresthesias are detected; speech is fluent/normal Psychiatric:  The pt has Normal affect. Lymph:  Unremarkable  CBC    Component Value Date/Time   WBC 7.8 10/21/2023 0645   RBC 5.14 10/21/2023 0645   HGB 14.9 10/21/2023 0645   HCT 45.7 10/21/2023 0645   PLT 251 10/21/2023 0645   MCV 88.9 10/21/2023 0645   MCH 29.0 10/21/2023 0645   MCHC 32.6 10/21/2023 0645   RDW 13.1 10/21/2023 0645   LYMPHSABS 2.9 06/24/2018 1822   MONOABS 0.8 06/24/2018 1822   EOSABS 0.1 06/24/2018 1822   BASOSABS 0.1 06/24/2018 1822    BMET    Component Value Date/Time   NA 143 10/20/2023 1302   K 3.8 10/20/2023 1302   CL 104 10/20/2023 1302   CO2 28 10/20/2023 1302   GLUCOSE 86 10/20/2023 1302   BUN 7 10/20/2023 1302   CREATININE 0.79 10/20/2023 1302   CALCIUM 9.6 10/20/2023 1302   GFRNONAA >60 10/20/2023 1302   GFRAA >60 12/29/2018 0325    COAGS: Lab Results  Component Value Date   INR 1.2 10/20/2023     Non-Invasive Vascular Imaging:   EXAM:10/20/23 CT ANGIOGRAPHY CHEST WITH CONTRAST   TECHNIQUE: Multidetector CT imaging of the chest was  performed using the standard protocol during bolus administration of intravenous contrast. Multiplanar CT image reconstructions and MIPs were obtained to evaluate the vascular anatomy.   RADIATION DOSE REDUCTION: This exam was performed according to the departmental dose-optimization program which includes automated exposure control, adjustment of the mA and/or kV according to patient size and/or use of iterative reconstruction technique.   CONTRAST:  75mL OMNIPAQUE  IOHEXOL  350 MG/ML SOLN   COMPARISON:  None Available.   FINDINGS: Pulmonary Embolism: Occlusive and nonocclusive emboli within the segmental branches of the right upper lobe. Distal subsegmental embolus within the medial basal right lower lobe. Occlusive segmental and subsegmental emboli within the basilar segments of the left lower lobe with a proximal segmental embolus in the superior segment of the left lower lobe. No findings of right heart strain.   Cardiovascular: No cardiomegaly  or pericardial effusion.No aortic aneurysm.   Mediastinum/Nodes: No mediastinal mass. Small amount of nonexpansile soft tissue in the anterior mediastinum, likely residual thymic tissue No mediastinal, hilar, or axillary lymphadenopathy.   Lungs/Pleura: The midline trachea and bronchi are patent. No focal airspace consolidation, pleural effusion, or pneumothorax.   Musculoskeletal: No acute fracture or destructive bone lesion.   Upper Abdomen: No acute abnormality in the partially visualized upper abdomen.   Review of the MIP images confirms the above findings.   IMPRESSION: 1. Moderate volume segmental and subsegmental pulmonary emboli scattered throughout both lungs, as delineated above. No findings of right heart strain. 2. No pneumonia, pulmonary edema, or pleural effusion.  Statin:  No. Beta Blocker:  No. Aspirin:  No. ACEI:  No. ARB:  No. CCB use:  No Other antiplatelets/anticoagulants:  No.    ASSESSMENT/PLAN:  This is a 28 y.o. male who presents to [facility] emergency department yesterday on 10/20/2023 with worsening chest pains.  Patient's symptoms started 2 to 3 months ago as intermittent sharp chest pain mostly located on his left side.  He presented to the emergency department 4 weeks ago for a workup that included EKG troponin study for chest pain which were all negative.  He was sent home with over-the-counter pain medicine he continued to experience chest pains and came back yesterday for further evaluation.  Upon workup the patient's blood pressure and heart rate were within normal limits.  Patient had oxygen saturation of 100% on room air.  Due to the continued chest discomfort a CTA of his chest with PE protocol was done.  He was found to have moderate volume segmental and subsegmental pulmonary emboli scattered throughout both lungs.  He did not have a pneumonia, pulmonary edema or pleural effusion.  He does not have any right heart strain noted on the scan as well.  However he does feel chest pressure that increases as he takes deep breaths which is to be expected with the findings on the CTA of his chest.  Vascular surgery recommends that the patient continue on IV heparin  infusion for another 24 hours.  At that point we will reevaluate the patient's condition.  At this point with stable vitals and oxygen saturation at 100% I believe that the chest pressure/chest pain that he is feeling is due to his deep inspiration and not clinical chest pain.  Therefore at this time I cannot determine whether or not he would actually benefit from a pulmonary thrombectomy.  The patient's symptoms started 2 to 3 months ago therefore this could be more chronic in nature than acute pulmonary embolism.  In this case he would be much better supported with oral anticoagulation rather than a procedure.  However we will reevaluate in the morning after the patient's been on a total of 48 hours of IV heparin .   -I discussed the  case in detail with Dr. Cordella Shawl MD and he agrees with the plan.   Gwendlyn JONELLE Shank [facility] 10/21/2023 11:30 AM

## 2023-10-22 ENCOUNTER — Other Ambulatory Visit: Payer: Self-pay

## 2023-10-22 LAB — MAGNESIUM: Magnesium: 2 mg/dL (ref 1.7–2.4)

## 2023-10-22 LAB — BASIC METABOLIC PANEL WITH GFR
Anion gap: 7 (ref 5–15)
BUN: 13 mg/dL (ref 6–20)
CO2: 28 mmol/L (ref 22–32)
Calcium: 9 mg/dL (ref 8.9–10.3)
Chloride: 103 mmol/L (ref 98–111)
Creatinine, Ser: 0.76 mg/dL (ref 0.61–1.24)
GFR, Estimated: >60 mL/min (ref >60)
Glucose, Bld: 131 mg/dL — ABNORMAL HIGH (ref 70–99)
Potassium: 3.5 mmol/L (ref 3.5–5.1)
Sodium: 138 mmol/L (ref 135–145)

## 2023-10-22 LAB — ECHOCARDIOGRAM COMPLETE
AV Peak grad: 6.6 mmHg
Ao pk vel: 1.28 m/s
Area-P 1/2: 6.17 cm2
S' Lateral: 3.1 cm
Weight: 2096 [oz_av]

## 2023-10-22 LAB — CBC
HCT: 43 % (ref 39.0–52.0)
Hemoglobin: 14.3 g/dL (ref 13.0–17.0)
MCH: 28.8 pg (ref 26.0–34.0)
MCHC: 33.3 g/dL (ref 30.0–36.0)
MCV: 86.7 fL (ref 80.0–100.0)
Platelets: 240 K/uL (ref 150–400)
RBC: 4.96 MIL/uL (ref 4.22–5.81)
RDW: 12.9 % (ref 11.5–15.5)
WBC: 10.8 K/uL — ABNORMAL HIGH (ref 4.0–10.5)
nRBC: 0 % (ref 0.0–0.2)

## 2023-10-22 LAB — HEPARIN LEVEL (UNFRACTIONATED): Heparin Unfractionated: 0.35 [IU]/mL (ref 0.30–0.70)

## 2023-10-22 LAB — GLUCOSE, CAPILLARY: Glucose-Capillary: 199 mg/dL — ABNORMAL HIGH (ref 70–99)

## 2023-10-22 LAB — PHOSPHORUS: Phosphorus: 3.7 mg/dL (ref 2.5–4.6)

## 2023-10-22 MED ORDER — APIXABAN (ELIQUIS) VTE STARTER PACK (10MG AND 5MG)
ORAL_TABLET | ORAL | 5 refills | Status: AC
  Filled 2023-10-22: qty 74, 30d supply, fill #0

## 2023-10-22 MED ORDER — APIXABAN 5 MG PO TABS
10.0000 mg | ORAL_TABLET | Freq: Two times a day (BID) | ORAL | Status: DC
  Administered 2023-10-22 (×2): 10 mg via ORAL
  Filled 2023-10-22: qty 2

## 2023-10-22 MED ORDER — APIXABAN 5 MG PO TABS: 5.0000 mg | ORAL_TABLET | Freq: Two times a day (BID) | ORAL | Status: DC

## 2023-10-22 MED ORDER — ACETAMINOPHEN 325 MG PO TABS: 650.0000 mg | ORAL_TABLET | Freq: Four times a day (QID) | ORAL | Status: AC | PRN

## 2023-10-22 NOTE — Progress Notes (Signed)
 Progress Note    10/22/2023 9:39 AM * No surgery found *  Subjective:  Jason Wise is a 28 yo male who presents to [facility] emergency department for intermittent sharp chest pain mostly located in the left side worsening with deep breaths.  His past medical history includes bipolar disorder with panic attacks.  Upon workup patient underwent CTA of the chest with PE protocol.  He was noted to have bilateral pulmonary embolisms with minimal to moderate clot burden without heart strain.  He was started on a heparin  drip approximately 42 hours ago.  Patient yesterday on consultation was on room air with oxygen saturations of 100%.  His EKG was normal sinus rhythm.  However he did complain of feeling chest pressure/chest pain upon deep inspiration.  This morning patient states he has a feeling better but does not feel any worse.  He still remains on room air oxygen with sats of 100%.  Upon deep breath he can still feel pressure in his chest.  Patient is ambulating back and forth to the bathroom without difficulties.  No complaints overnight.  Vitals all remained stable.   Vitals:   10/22/23 0346 10/22/23 0832  BP: 125/73 119/72  Pulse: 63 63  Resp: 18   Temp: 97.9 F (36.6 C) 97.9 F (36.6 C)  SpO2: 100% 100%   Physical Exam: Cardiac:  RRR, normal S1 and S2, no murmurs. Lungs: Lung sounds clear on auscultation throughout.  No rales rhonchi or wheezing noted. Incisions: None Extremities: All extremities with palpable pulses and are warm to touch. Abdomen: Positive bowel sounds throughout, soft, nontender and nondistended. Neurologic: Alert and oriented x 3, answers all questions follows commands appropriately.  Patient does not appear anxious or panicked at this morning.  CBC    Component Value Date/Time   WBC 10.8 (H) 10/22/2023 0459   RBC 4.96 10/22/2023 0459   HGB 14.3 10/22/2023 0459   HCT 43.0 10/22/2023 0459   PLT 240 10/22/2023 0459   MCV 86.7 10/22/2023 0459   MCH 28.8  10/22/2023 0459   MCHC 33.3 10/22/2023 0459   RDW 12.9 10/22/2023 0459   LYMPHSABS 2.9 06/24/2018 1822   MONOABS 0.8 06/24/2018 1822   EOSABS 0.1 06/24/2018 1822   BASOSABS 0.1 06/24/2018 1822    BMET    Component Value Date/Time   NA 138 10/22/2023 0459   K 3.5 10/22/2023 0459   CL 103 10/22/2023 0459   CO2 28 10/22/2023 0459   GLUCOSE 131 (H) 10/22/2023 0459   BUN 13 10/22/2023 0459   CREATININE 0.76 10/22/2023 0459   CALCIUM 9.0 10/22/2023 0459   GFRNONAA >60 10/22/2023 0459   GFRAA >60 12/29/2018 0325    INR    Component Value Date/Time   INR 1.2 10/20/2023 1729     Intake/Output Summary (Last 24 hours) at 10/22/2023 0939 Last data filed at 10/21/2023 1335 Gross per 24 hour  Intake 120 ml  Output --  Net 120 ml     Assessment/Plan:  28 y.o. male who presents to [facility] emergency department with left-sided chest pain.  Patient underwent CTA of the chest revealing bilateral pulmonary some segmental months of mental embolisms.  These appear on CAT scan to be minimal clot burden.  He has no heart strain.  He remains on room air with 100% oxygen saturation.  He remains on a heparin  drip this morning.* No surgery found *   PLAN After evaluation this morning patient seems to be comfortable and breathing well.  Patient's saturations  remained between 98 to 100% on room air.  He does still feel on deep inspiration some pressure in his chest which is normal at this time.  Patient has now been on heparin  almost 48 hours and his symptoms remain the same.  At this time I do not believe he would benefit from a pulmonary thrombectomy due to the clot burden and his ability to ambulate without any distress.  He also does not have any right heart strain.  Going forward the best for Mr. Courville would be anticoagulation.  We recommend that he start on Eliquis  10 mg twice daily for 7 days then reduce to 5 mg twice daily for at least a year.  We can discontinue the heparin  infusion this morning  and start him on his Eliquis  today.  This clot burden seems to be unprovoked as his ultrasounds of his bilateral lower extremities were negative.  I discussed the plan with the patient this morning after examining him.  Patient will follow-up with us  in 6 to 8 weeks in clinic.  DVT prophylaxis: Heparin  infusion to be converted to oral anticoagulation Eliquis  10 mg twice daily   Gwendlyn JONELLE Shank [facility] 10/22/2023 9:39 AM

## 2023-10-22 NOTE — TOC Initial Note (Addendum)
 Transition of Care [facility]) - Initial/Assessment Note    Patient Details  Name: Jason Wise MRN: [id number] Date of Birth: 1995-07-25  [facility] CM/SW Contact:    Jason [name], LCSW Phone Number: 10/22/2023, 11:18 AM  Clinical Narrative:  CSW met with patient. No family at bedside. CSW introduced role and inquired about not having a PCP which he confirmed. Inquired about not having insurance. Patient said he thinks he has [facility] Medicaid. CSW Patent examiner asking her to verify. Provided PCP packet for free/low-cost healthcare in [place]. Also encouraged patient to reach out to his Medicaid worker (if active) to request to be assigned to a provider. No further concerns. CSW will continue to follow patient for support and facilitate return home once stable. Patient confirmed he should have a ride at discharge.               12:25 pm: Per Artist, no Medicaid for July or August. Account is currently under review with [employer].   Expected Discharge Plan: Home/Self Care Barriers to Discharge: Continued Medical Work up   Patient Goals and CMS Choice            Expected Discharge Plan and Services     Post Acute Care Choice: NA Living arrangements for the past 2 months: Single Family Home                                      Prior Living Arrangements/Services Living arrangements for the past 2 months: Single Family Home   Patient language and need for interpreter reviewed:: Yes Do you feel safe going back to the place where you live?: Yes      Need for Family Participation in Patient Care: Yes (Comment)     Criminal Activity/Legal Involvement Pertinent to Current Situation/Hospitalization: No - Comment as needed  Activities of Daily Living      Permission Sought/Granted                  Emotional Assessment Appearance:: Appears stated age Attitude/Demeanor/Rapport: Engaged, Gracious Affect  (typically observed): Accepting, Appropriate, Calm, Pleasant Orientation: : Oriented to Self, Oriented to Place, Oriented to  Time, Oriented to Situation Alcohol / Substance Use: Not Applicable Psych Involvement: No (comment)  Admission diagnosis:  Pulmonary emboli (HCC) [I26.99] Other acute pulmonary embolism without acute cor pulmonale (HCC) [I26.99] Chest pain, unspecified type [R07.9] Pulmonary embolism (HCC) [I26.99] Patient Active Problem List   Diagnosis Date Noted   Pulmonary embolism (HCC) 10/21/2023   Chest pain 10/21/2023   Acute pulmonary embolism (HCC) 10/20/2023   Pulmonary emboli (HCC) 10/20/2023   Suicidal ideation 12/29/2018   Substance induced mood disorder (HCC) 05/08/2016   Bipolar depression (HCC) 05/08/2016   PCP:  Freddrick No Pharmacy:   [facility] [address]), [place] - [address] [place] ([address]) [place] [address] Phone: [phone] Fax: [phone]  [id number] [place] [facility] [place] - [address] [place] [address] Phone: [phone] Fax: [phone]  [facility] [address] [place] - [address] [place] [address] Phone: [phone] Fax: [phone]     Social Drivers of Health (SDOH) Social History: SDOH Screenings   Food Insecurity: No Food Insecurity (10/21/2023)  Tobacco Use: High Risk (10/20/2023)   SDOH Interventions:     Readmission Risk Interventions  No data to display

## 2023-10-22 NOTE — Discharge Summary (Signed)
 [facility] Discharge Summary   Patient: Jason Wise FMW:[id number]  PCP: Pcp, No  Date of admission: 10/20/2023   Date of discharge:  10/22/2023     Discharge Diagnoses:  Principal Problem:   Pulmonary emboli [facility]) Active Problems:   Acute pulmonary embolism (HCC)   Pulmonary embolism (HCC)   Chest pain   Admitted From: Home Disposition:  Home   Recommendations for Outpatient Follow-up:  Follow-up with PCP in 1 week Follow-up with hematology in 1 week for hypercoagulable workup as an outpatient and duration of anticoagulation treatment. Follow up LABS/TEST: CBC in 1 week   Follow-up Information     Babara Call, MD Follow up in 1 month(s).   Specialty: Oncology Why: PE unprovoked Contact information: [address] [place] [address] [phone]         PCP Follow up in 1 week(s).                 Diet recommendation: Regular diet  Activity: The patient is advised to gradually reintroduce usual activities, as tolerated  Discharge Condition: stable  Code Status: Full code   History of present illness: As per the H and P dictated on admission.  Hospital Course:  Jason Wise is a 28 y.o. male with no significant past medical he presented with worsening of chest pains.   Symptoms started about 2 months ago, when patient started to have intermittent sharp like chest pain, mostly located on the left side rib cage, worsening with cough and deep breath, denied any fever or chills no weight loss.  Came in 4 weeks ago to ED, and workup including EKG and troponin study were negative and patient sent home with as needed over-the-counter pain meds.  Patient however continued to experience chest pains and decided to come back today.  His father died of massive heart attack in the age of 27s, otherwise no known history of coagulation problems in the family.   ED Course: Afebrile, pulse 100 blood pressure 132/77 O2 saturation 100 % on room air. CTA showed  bilateral PE but no right heart strain, troponin negative, hemoglobin 16 WBC 10.4.   Patient started on heparin  drip in the ED     Assessment and Plan:  # Bilateral PE, unprovoked Presented with chest pain due to PE -s/p Heparin  IV infusion given for 48 hours.  Gradually patient's chest pain improved.  Vascular surgery was consulted but recommended that patient does not need any surgical intervention, continue heparin  IV infusion and switch to Eliquis  when stable. Venous duplex negative for DVT  -No signs of lung infarction on CTA, s/p oxycodone  as needed for pain control. TTE: LVEF 55 to 60%, no WMA, no any other significant findings. - Outpatient follow-up with hematology for hypercoagulable state workup, follow-up information given in discharge section. Clinically patient's condition improved and stable to discharge.  Patient agreed with the discharge planning.    # Nicotine  dependence: Smoking cessation counseling done.   Body mass index is 19.35 kg/m.  Nutrition Interventions:  - Patient was instructed, not to drive, operate heavy machinery, perform activities at heights, swimming or participation in water activities or provide baby sitting services while on Pain, Sleep and Anxiety Medications; until his outpatient Physician has advised to do so again.  - Also recommended to not to take more than prescribed Pain, Sleep and Anxiety Medications.  Patient was ambulatory without any assistance. On the day of the discharge the patient's vitals were stable, and no other acute  medical condition were reported by patient. the patient was felt safe to be discharge at Home.  Consultants: Vascular surgery Procedures: None  Discharge Exam: General: Appear in no distress, no Rash; Oral Mucosa Clear, moist. Cardiovascular: S1 and S2 Present, no Murmur, Respiratory: normal respiratory effort, Bilateral Air entry present and no Crackles, no wheezes Abdomen: Bowel Sound present, Soft and no  tenderness, no hernia Extremities: no Pedal edema, no calf tenderness Neurology: alert and oriented to time, place, and person affect appropriate.  Filed Weights   10/20/23 1653  Weight: 59.4 kg   Vitals:   10/22/23 0832 10/22/23 1316  BP: 119/72 128/81  Pulse: 63 77  Resp:    Temp: 97.9 F (36.6 C) 98.1 F (36.7 C)  SpO2: 100% 100%    DISCHARGE MEDICATION: Allergies as of 10/22/2023   No Known Allergies      Medication List     STOP taking these medications    gabapentin  300 MG capsule Commonly known as: Neurontin    HYDROcodone -acetaminophen  5-325 MG tablet Commonly known as: NORCO/VICODIN   hydrOXYzine  25 MG tablet Commonly known as: ATARAX    ibuprofen  600 MG tablet Commonly known as: ADVIL    predniSONE  10 MG (21) Tbpk tablet Commonly known as: STERAPRED UNI-PAK 21 TAB       TAKE these medications    acetaminophen  325 MG tablet Commonly known as: TYLENOL  Take 2 tablets (650 mg total) by mouth every 6 (six) hours as needed for mild pain (pain score 1-3), fever, headache or moderate pain (pain score 4-6) (or Fever >/= 101).   Apixaban  Starter Pack (10mg  and 5mg ) Commonly known as: ELIQUIS  STARTER PACK Take as directed on package: start with two-5mg  tablets twice daily for 7 days. On day 8, switch to one-5mg  tablet twice daily.   OLANZapine  2.5 MG tablet Commonly known as: ZyPREXA  Take 0.5 tablets (1.25 mg total) by mouth at bedtime.   omeprazole  20 MG tablet Commonly known as: PriLOSEC  OTC Take 1 tablet (20 mg total) by mouth daily.       No Known Allergies Discharge Instructions     Call MD for:   Complete by: As directed    Bruises or bleeding   Call MD for:  difficulty breathing, headache or visual disturbances   Complete by: As directed    Call MD for:  extreme fatigue   Complete by: As directed    Call MD for:  persistant dizziness or light-headedness   Complete by: As directed    Call MD for:  persistant nausea and vomiting    Complete by: As directed    Call MD for:  severe uncontrolled pain   Complete by: As directed    Call MD for:  temperature >100.4   Complete by: As directed    Diet - low sodium heart healthy   Complete by: As directed    Discharge instructions   Complete by: As directed    Follow-up with PCP in 1 week Follow-up with hematology in 1 week for hypercoagulable workup as an outpatient and duration of anticoagulation treatment.   Increase activity slowly   Complete by: As directed        The results of significant diagnostics from this hospitalization (including imaging, microbiology, ancillary and laboratory) are listed below for reference.    Significant Diagnostic Studies: ECHOCARDIOGRAM COMPLETE Result Date: 10/22/2023    ECHOCARDIOGRAM REPORT   Patient Name:   Jason Wise Date of Exam: 10/21/2023 Medical Rec #:  [id number]  Height:       69.0 in Accession #:    [id number]     Weight:       131.0 lb Date of Birth:  [birth year]       BSA:          1.726 m Patient Age:    28 years       BP:           130/77 mmHg Patient Gender: M              HR:           70 bpm. Exam Location:  [facility] Procedure: 2D Echo, Cardiac Doppler and Color Doppler (Both Spectral and Color            Flow Doppler were utilized during procedure). Indications:     Pulmonary Embolus I26.09  History:         Patient has no prior history of Echocardiogram examinations.                  Signs/Symptoms:Chest Pain.  Sonographer:     Thea Norlander RCS Referring Phys:  [id number] CORT ONEIDA MANA Diagnosing Phys: Marsa Dooms MD IMPRESSIONS  1. Left ventricular ejection fraction, by estimation, is 55 to 60%. The left ventricle has normal function. The left ventricle has no regional wall motion abnormalities. Left ventricular diastolic parameters were normal.  2. Right ventricular systolic function is normal. The right ventricular size is normal.  3. The mitral valve is normal in structure. Trivial mitral valve regurgitation. No  evidence of mitral stenosis.  4. The aortic valve is normal in structure. Aortic valve regurgitation is not visualized. No aortic stenosis is present.  5. The inferior vena cava is normal in size with greater than 50% respiratory variability, suggesting right atrial pressure of 3 mmHg. FINDINGS  Left Ventricle: Left ventricular ejection fraction, by estimation, is 55 to 60%. The left ventricle has normal function. The left ventricle has no regional wall motion abnormalities. Strain was performed and the global longitudinal strain is indeterminate. The left ventricular internal cavity size was normal in size. There is no left ventricular hypertrophy. Left ventricular diastolic parameters were normal. Right Ventricle: The right ventricular size is normal. No increase in right ventricular wall thickness. Right ventricular systolic function is normal. Left Atrium: Left atrial size was normal in size. Right Atrium: Right atrial size was normal in size. Pericardium: There is no evidence of pericardial effusion. Mitral Valve: The mitral valve is normal in structure. Trivial mitral valve regurgitation. No evidence of mitral valve stenosis. Tricuspid Valve: The tricuspid valve is normal in structure. Tricuspid valve regurgitation is trivial. No evidence of tricuspid stenosis. Aortic Valve: The aortic valve is normal in structure. Aortic valve regurgitation is not visualized. No aortic stenosis is present. Aortic valve peak gradient measures 6.6 mmHg. Pulmonic Valve: The pulmonic valve was normal in structure. Pulmonic valve regurgitation is not visualized. No evidence of pulmonic stenosis. Aorta: The aortic root is normal in size and structure. Venous: The inferior vena cava is normal in size with greater than 50% respiratory variability, suggesting right atrial pressure of 3 mmHg. IAS/Shunts: No atrial level shunt detected by color flow Doppler. Additional Comments: 3D was performed not requiring image post processing on an  independent workstation and was indeterminate.  LEFT VENTRICLE PLAX 2D LVIDd:         4.50 cm Diastology LVIDs:         3.10 cm LV e' medial:  16.50 cm/s LV PW:         0.80 cm LV E/e' medial:  8.0 LV IVS:        0.60 cm LV e' lateral:   20.50 cm/s                        LV E/e' lateral: 6.4  RIGHT VENTRICLE             IVC RV S prime:     15.10 cm/s  IVC diam: 2.20 cm TAPSE (M-mode): 2.2 cm LEFT ATRIUM             Index        RIGHT ATRIUM           Index LA diam:        2.70 cm 1.56 cm/m   RA Area:     10.40 cm LA Vol (A2C):   20.2 ml 11.70 ml/m  RA Volume:   22.10 ml  12.80 ml/m LA Vol (A4C):   35.4 ml 20.51 ml/m LA Biplane Vol: 29.2 ml 16.92 ml/m  AORTIC VALVE AV Vmax:      128.00 cm/s AV Peak Grad: 6.6 mmHg LVOT Vmax:    117.00 cm/s LVOT Vmean:   75.200 cm/s LVOT VTI:     0.216 m MITRAL VALVE MV Area (PHT): 6.17 cm     SHUNTS MV Decel Time: 123 msec     Systemic VTI: 0.22 m MV E velocity: 132.00 cm/s MV A velocity: 63.50 cm/s MV E/A ratio:  2.08 Marsa Dooms MD Electronically signed by Marsa Dooms MD Signature Date/Time: 10/22/2023/1:22:59 PM    Final    US  Venous Img Lower Bilateral (DVT) Result Date: 10/20/2023 CLINICAL DATA:  [id number] PE (pulmonary thromboembolism) (HCC) [id number] EXAM: BILATERAL LOWER EXTREMITY VENOUS DOPPLER ULTRASOUND TECHNIQUE: Gray-scale sonography with graded compression, as well as color Doppler and duplex ultrasound were performed to evaluate the lower extremity deep venous systems from the level of the common femoral vein and including the common femoral, femoral, profunda femoral, popliteal and calf veins including the posterior tibial, peroneal and gastrocnemius veins when visible. The superficial great saphenous vein was also interrogated. Spectral Doppler was utilized to evaluate flow at rest and with distal augmentation maneuvers in the common femoral, femoral and popliteal veins. COMPARISON:  None Available. FINDINGS: RIGHT LOWER EXTREMITY Common Femoral  Vein: No evidence of thrombus. Normal compressibility, respiratory phasicity and response to augmentation. Saphenofemoral Junction: No evidence of thrombus. Normal compressibility and flow on color Doppler imaging. Profunda Femoral Vein: No evidence of thrombus. Normal compressibility and flow on color Doppler imaging. Femoral Vein: No evidence of thrombus. Normal compressibility, respiratory phasicity and response to augmentation. Popliteal Vein: No evidence of thrombus. Normal compressibility, respiratory phasicity and response to augmentation. Calf Veins: No evidence of thrombus. Normal compressibility and flow on color Doppler imaging. Superficial Great Saphenous Vein: No evidence of thrombus. Normal compressibility. Other Findings:  None. LEFT LOWER EXTREMITY Common Femoral Vein: No evidence of thrombus. Normal compressibility, respiratory phasicity and response to augmentation. Saphenofemoral Junction: No evidence of thrombus. Normal compressibility and flow on color Doppler imaging. Profunda Femoral Vein: No evidence of thrombus. Normal compressibility and flow on color Doppler imaging. Femoral Vein: No evidence of thrombus. Normal compressibility, respiratory phasicity and response to augmentation. Popliteal Vein: No evidence of thrombus. Normal compressibility, respiratory phasicity and response to augmentation. Calf Veins: No evidence of thrombus. Normal compressibility and flow on color Doppler imaging. Superficial Great Saphenous Vein:  No evidence of thrombus. Normal compressibility. Other Findings:  None. IMPRESSION: Negative for deep venous thrombosis within both legs. Electronically Signed   By: Rogelia Myers M.D.   On: 10/20/2023 19:13   CT Angio Chest PE W/Cm &/Or Wo Cm Result Date: 10/20/2023 CLINICAL DATA:  Pulmonary embolism (PE) suspected, low to intermediate prob, positive D-dimer EXAM: CT ANGIOGRAPHY CHEST WITH CONTRAST TECHNIQUE: Multidetector CT imaging of the chest was performed using  the standard protocol during bolus administration of intravenous contrast. Multiplanar CT image reconstructions and MIPs were obtained to evaluate the vascular anatomy. RADIATION DOSE REDUCTION: This exam was performed according to the departmental dose-optimization program which includes automated exposure control, adjustment of the mA and/or kV according to patient size and/or use of iterative reconstruction technique. CONTRAST:  75mL OMNIPAQUE  IOHEXOL  350 MG/ML SOLN COMPARISON:  None Available. FINDINGS: Pulmonary Embolism: Occlusive and nonocclusive emboli within the segmental branches of the right upper lobe. Distal subsegmental embolus within the medial basal right lower lobe. Occlusive segmental and subsegmental emboli within the basilar segments of the left lower lobe with a proximal segmental embolus in the superior segment of the left lower lobe. No findings of right heart strain. Cardiovascular: No cardiomegaly or pericardial effusion.No aortic aneurysm. Mediastinum/Nodes: No mediastinal mass. Small amount of nonexpansile soft tissue in the anterior mediastinum, likely residual thymic tissue No mediastinal, hilar, or axillary lymphadenopathy. Lungs/Pleura: The midline trachea and bronchi are patent. No focal airspace consolidation, pleural effusion, or pneumothorax. Musculoskeletal: No acute fracture or destructive bone lesion. Upper Abdomen: No acute abnormality in the partially visualized upper abdomen. Review of the MIP images confirms the above findings. IMPRESSION: 1. Moderate volume segmental and subsegmental pulmonary emboli scattered throughout both lungs, as delineated above. No findings of right heart strain. 2. No pneumonia, pulmonary edema, or pleural effusion. Critical Value/emergent results were called by telephone at the time of interpretation on 10/20/2023 at 4:42 pm to provider Dr Dorothyann, who verbally acknowledged these results. Electronically Signed   By: Rogelia Myers M.D.   On:  10/20/2023 16:44   DG Chest 2 View Result Date: 10/20/2023 CLINICAL DATA:  Chest pain EXAM: CHEST - 2 VIEW COMPARISON:  09/29/2023 FINDINGS: The heart size and mediastinal contours are within normal limits. Both lungs are clear. The visualized skeletal structures are unremarkable. No pneumothorax. IMPRESSION: No active cardiopulmonary disease. Electronically Signed   By: Franky Crease M.D.   On: 10/20/2023 14:06   DG Chest 2 View Result Date: 09/29/2023 EXAM: 2 VIEW(S) XRAY OF THE CHEST 09/29/2023 09:51:33 PM COMPARISON: 06/24/2018 CLINICAL HISTORY: CP. PER ER NOTE; Pt to ED via POV c/o central CP that radiates to the left chest x3 weeks. Got worse today. Endorses some generalized weakness, denies SOB, fevers, dizziness. FINDINGS: LUNGS AND PLEURA: No focal pulmonary opacity. No pulmonary edema. No pleural effusion. No pneumothorax. HEART AND MEDIASTINUM: No acute abnormality of the cardiac and mediastinal silhouettes. BONES AND SOFT TISSUES: No acute osseous abnormality. IMPRESSION: 1. No acute process. Electronically signed by: Pinkie Pebbles MD 09/29/2023 09:54 PM EDT RP Workstation: [id number]    Microbiology: No results found for this or any previous visit (from the past 240 hours).   Labs: CBC: Recent Labs  Lab 10/20/23 1302 10/21/23 0645 10/22/23 0459  WBC 10.4 7.8 10.8*  HGB 16.0 14.9 14.3  HCT 47.3 45.7 43.0  MCV 86.0 88.9 86.7  PLT 282 251 240   Basic Metabolic Panel: Recent Labs  Lab 10/20/23 1302 10/22/23 0459  NA 143 138  K 3.8  3.5  CL 104 103  CO2 28 28  GLUCOSE 86 131*  BUN 7 13  CREATININE 0.79 0.76  CALCIUM 9.6 9.0  MG  --  2.0  PHOS  --  3.7   Liver Function Tests: Recent Labs  Lab 10/20/23 1302  AST 22  ALT 16  ALKPHOS 49  BILITOT 1.1  PROT 7.5  ALBUMIN 4.4   Recent Labs  Lab 10/20/23 1302  LIPASE 27   No results for input(s): AMMONIA in the last 168 hours. Cardiac Enzymes: No results for input(s): CKTOTAL, CKMB, CKMBINDEX,  TROPONINI in the last 168 hours. BNP (last 3 results) No results for input(s): BNP in the last 8760 hours. CBG: No results for input(s): GLUCAP in the last 168 hours.  Time spent: 35 minutes  Signed:  Elvan Sor  [facility] 10/22/2023 2:37 PM

## 2023-10-22 NOTE — TOC Transition Note (Signed)
 Transition of Care [facility]) - Discharge Note   Patient Details  Name: Jason Wise MRN: [id number] Date of Birth: 1995/08/31  [facility] CM/SW Contact:  Lauraine [name], LCSW Phone Number: 10/22/2023, 4:17 PM   Clinical Narrative:  Patient has orders to discharge home today. He was notified that his Medicaid is not active. He has left a voicemail for DSS to work on getting it reactivated. Gave [facility] intake paperwork. No further concerns. CSW signing off.   Final next level of care: Home/Self Care Barriers to Discharge: Barriers Resolved   Patient Goals and CMS Choice            Discharge Placement                    Patient and family notified of of transfer: 10/22/23  Discharge Plan and Services Additional resources added to the After Visit Summary for       Post Acute Care Choice: NA                               Social Drivers of Health (SDOH) Interventions SDOH Screenings   Food Insecurity: No Food Insecurity (10/21/2023)  Tobacco Use: High Risk (10/20/2023)     Readmission Risk Interventions     No data to display

## 2023-10-22 NOTE — Consult Note (Signed)
 PHARMACY - ANTICOAGULATION CONSULT NOTE  Pharmacy Consult for Heparin  infusion Indication: pulmonary embolus  Patient Measurements: Weight: 59.4 kg (131 lb)  Labs: Recent Labs    10/20/23 1302 10/20/23 1435 10/20/23 1729 10/21/23 0045 10/21/23 0645 10/21/23 0648 10/21/23 1428 10/21/23 2027 10/22/23 0459  HGB 16.0  --   --   --  14.9  --   --   --  14.3  HCT 47.3  --   --   --  45.7  --   --   --  43.0  PLT 282  --   --   --  251  --   --   --  240  APTT  --   --  117*  --   --   --   --   --   --   LABPROT  --   --  15.5*  --   --   --   --   --   --   INR  --   --  1.2  --   --   --   --   --   --   HEPARINUNFRC  --   --   --    < >  --    < > 0.23* 0.49 0.35  CREATININE 0.79  --   --   --   --   --   --   --  0.76  TROPONINIHS 3 3  --   --   --   --   --   --   --    < > = values in this interval not displayed.    Estimated Creatinine Clearance: 117.6 mL/min (by C-G formula based on SCr of 0.76 mg/dL).   Medical History: Past Medical History:  Diagnosis Date   Bipolar disorder (HCC)    Panic attack    Medications:  No anticoagulation prior to admission per my chart review  Assessment: Patient with no significant medical history presents to ED with worsening chest pain. CT shows bilateral PE without right heart strain. Pharmacy consulted to initiate and manage heparin  infusion.  Date Time Results Comments 8/12 0045 HL=0.30 Therapeutic x 1 at  1000 units/hr 8/12 0648 HL=0.26 Subtherapeutic 8/12 1428 HL=0.23 Subtherapeutic at 1200 units/hour 8/12 2027 HL=0.49 Therapeutic x 1 8/13     0459    HL=0.35          Therapeutic X 2   Goal of Therapy:  Heparin  level 0.3-0.7 units/ml Monitor platelets by anticoagulation protocol: Yes   Plan:  Continue heparin  infusion at 1400 units/hr Next HL and CBC tomorrow AM  Lexine Jaspers D 10/22/2023 6:28 AM

## 2023-10-22 NOTE — Progress Notes (Signed)
 Pt given dc/rx instructions. Pt advised to return to any ED as needed/worse. Pt voices understanding. Pt with his cell phone/charger, has had eliquis  started pack and his clothes delivered to room. Pt states needing to wait 2 hrs for his mother to get out of work.

## 2023-10-22 NOTE — Plan of Care (Signed)

## 2023-10-23 ENCOUNTER — Emergency Department: Payer: Self-pay

## 2023-10-23 ENCOUNTER — Other Ambulatory Visit: Payer: Self-pay

## 2023-10-23 ENCOUNTER — Emergency Department
Admission: EM | Admit: 2023-10-23 | Discharge: 2023-10-23 | Disposition: A | Discharge status: Home / Self Care | Payer: Self-pay | Attending: Emergency Medicine | Admitting: Emergency Medicine

## 2023-10-23 DIAGNOSIS — Z7901 Long term (current) use of anticoagulants: Secondary | ICD-10-CM | POA: Insufficient documentation

## 2023-10-23 DIAGNOSIS — R0781 Pleurodynia: Secondary | ICD-10-CM | POA: Insufficient documentation

## 2023-10-23 DIAGNOSIS — M25512 Pain in left shoulder: Secondary | ICD-10-CM | POA: Insufficient documentation

## 2023-10-23 MED ORDER — LIDOCAINE 5 % EX PTCH: 1.0000 | MEDICATED_PATCH | CUTANEOUS | 0 refills | Status: AC

## 2023-10-23 MED ORDER — OXYCODONE-ACETAMINOPHEN 5-325 MG PO TABS
1.0000 | ORAL_TABLET | Freq: Once | ORAL | Status: AC
  Administered 2023-10-23: 1 via ORAL
  Filled 2023-10-23: qty 1

## 2023-10-23 MED ORDER — ACETAMINOPHEN 325 MG PO TABS
650.0000 mg | ORAL_TABLET | Freq: Once | ORAL | Status: AC
  Administered 2023-10-23: 650 mg via ORAL
  Filled 2023-10-23: qty 2

## 2023-10-23 NOTE — ED Provider Notes (Signed)
 [facility] Provider Note    Event Date/Time   First MD Initiated Contact with Patient 10/23/23 2014     (approximate)   History   Shoulder Pain   HPI  Jason Wise is a 28 y.o. male  with a past medical history of substance-induced mood disorder, bipolar depression, nicotine  use, alcohol abuse, acute pulmonary embolism presents to the emergency department with left shoulder and left lower rib pain x 1 day.  Patient denies any shortness of breath or chest pain, numbness or tingling down the arm, abdominal pain, fall or injury.  Patient states his rib pain is worse with deep breathing.   Reviewed note from 8/11 patient was admitted to ER for acute pulmonary embolism after heparin  infusion, no right heart strain, placed on anticoagulation with Eliquis   and d/c'd yesterday. Physical Exam   Triage Vital Signs: ED Triage Vitals  Encounter Vitals Group     BP 10/23/23 1749 129/75     Girls Systolic BP Percentile --      Girls Diastolic BP Percentile --      Boys Systolic BP Percentile --      Boys Diastolic BP Percentile --      Pulse Rate 10/23/23 1749 100     Resp 10/23/23 1749 17     Temp 10/23/23 1749 98 F (36.7 C)     Temp Source 10/23/23 1749 Oral     SpO2 10/23/23 1749 100 %     Weight 10/23/23 1750 131 lb (59.4 kg)     Height 10/23/23 1750 5' 9 (1.753 m)     Head Circumference --      Peak Flow --      Pain Score 10/23/23 1750 7     Pain Loc --      Pain Education --      Exclude from Growth Chart --     Most recent vital signs: Vitals:   10/23/23 2146 10/23/23 2310  BP: 135/66 133/72  Pulse: 88 91  Resp: 18 18  Temp: 97.9 F (36.6 C)   SpO2: 100% 100%    General: Awake, in no acute distress. Appears stated age. Neck: Supple. CV: Regular rate, 91 bpm. Peripheral pulses 2+ and symmetric. No edema. Respiratory: Breath sounds clear b/l. No wheezes, rales, or rhonchi. No respiratory distress. Normal respiratory effort.  GI: Soft,  non-distended. Mild tenderness to palpation of anterior lower left ribs. MSK: Normal ROM and  5/5 strength in b/l upper extremities. TTP along left AC joint and just above the left clavicle. No midline cervical spinal tenderness. Skin:Warm, dry, intact. No rashes, lesions, or ecchymosis. No cyanosis or pallor. Neurological: A&O. Sensation intact. Strength symmetric. No focal deficits.   ED Results / Procedures / Treatments   Labs (all labs ordered are listed, but only abnormal results are displayed) Labs Reviewed - No data to display   EKG     RADIOLOGY Xrays of left ribs and left shoulder ordered.     PROCEDURES:  Critical Care performed: No  Procedures   MEDICATIONS ORDERED IN ED: Medications  acetaminophen  (TYLENOL ) tablet 650 mg (650 mg Oral Given 10/23/23 2155)  oxyCODONE -acetaminophen  (PERCOCET/ROXICET) 5-325 MG per tablet 1 tablet (1 tablet Oral Given 10/23/23 2244)     IMPRESSION / MDM / ASSESSMENT AND PLAN / ED COURSE  I reviewed the triage vital signs and the nursing notes.  Differential diagnosis includes, but is not limited to, pulmonary embolism, pneumothorax, rib fracture, shoulder fracture, rotator cuff tear  Patient's presentation is most consistent with acute complicated illness / injury requiring diagnostic workup.  X-rays of left shoulder and ribs without any acute findings.  I independently reviewed these x-rays and agree with radiologists' readings.  Patient was seen here 3 days ago, diagnosed with pulmonary embolism and discharged yesterday, currently on regimen with Eliquis .  Believe his pain is a continuation of his prior pulmonary embolism diagnosis, but wanted to order x-rays to rule out other injuries.  Patient's vital signs within normal range, no SOB or CP.  Patient was provided with 1 dose of Tylenol  followed by 1 dose of Percocet for his pain.  Will have him follow-up with hematology in 1 week as recommended by the  hospitalist who discharged him yesterday.  He can follow-up in the emergency department for any new, worsening, or concerning symptoms.  I did provide him with lidocaine  patches in case his shoulder pain is unrelated to his prior PE.  Patient was given the opportunity to ask questions; all questions were answered. Emergency department return precautions were discussed with the patient.  Patient is in agreement to the treatment plan.  Patient is stable for discharge.    FINAL CLINICAL IMPRESSION(S) / ED DIAGNOSES   Final diagnoses:  Acute pain of left shoulder  Rib pain on left side     Rx / DC Orders   ED Discharge Orders          Ordered    lidocaine  (LIDODERM ) 5 %  Every 24 hours        10/23/23 2249             Note:  This document was prepared using Dragon voice recognition software and may include unintentional dictation errors.     Sheron Salm, PA-C 10/23/23 2332    Bradler, Evan K, MD 10/23/23 [phone]

## 2023-10-23 NOTE — ED Triage Notes (Signed)
 Pt sts that he was D/C from [facility] yesterday with blood clots in his left lung. Pt sts that he is having extreme pain  that is in his left shoulder. Pt sts that he is not having any SOB.

## 2023-10-23 NOTE — Discharge Instructions (Addendum)
 You were seen in the emergency department for left shoulder pain and left rib pain.  Please continue taking your medications at home for your blood clots in your lungs.  You may use Tylenol  and lidocaine  patches on your shoulder if needed.  I have provided you for follow-up with an orthopedic if your shoulder pain continues past 6 weeks.  You can also follow-up in the emergency department for any shortness of breath, chest pain, numbness or tingling down your arm, vision changes or for any new, concerning, or worsening symptoms.

## 2023-10-25 DIAGNOSIS — R1032 Left lower quadrant pain: Secondary | ICD-10-CM | POA: Insufficient documentation

## 2023-10-25 DIAGNOSIS — R110 Nausea: Secondary | ICD-10-CM | POA: Insufficient documentation

## 2023-10-25 DIAGNOSIS — I2699 Other pulmonary embolism without acute cor pulmonale: Secondary | ICD-10-CM | POA: Insufficient documentation

## 2023-10-25 DIAGNOSIS — Z7901 Long term (current) use of anticoagulants: Secondary | ICD-10-CM | POA: Insufficient documentation

## 2023-10-26 ENCOUNTER — Emergency Department
Admission: EM | Admit: 2023-10-26 | Discharge: 2023-10-26 | Disposition: A | Discharge status: Home / Self Care | Payer: Self-pay | Attending: Emergency Medicine | Admitting: Emergency Medicine

## 2023-10-26 ENCOUNTER — Emergency Department: Payer: Self-pay

## 2023-10-26 ENCOUNTER — Other Ambulatory Visit: Payer: Self-pay

## 2023-10-26 DIAGNOSIS — R079 Chest pain, unspecified: Secondary | ICD-10-CM

## 2023-10-26 DIAGNOSIS — R0781 Pleurodynia: Secondary | ICD-10-CM

## 2023-10-26 DIAGNOSIS — R109 Unspecified abdominal pain: Secondary | ICD-10-CM

## 2023-10-26 DIAGNOSIS — I2699 Other pulmonary embolism without acute cor pulmonale: Secondary | ICD-10-CM

## 2023-10-26 LAB — CBC
HCT: 46.2 % (ref 39.0–52.0)
Hemoglobin: 15.7 g/dL (ref 13.0–17.0)
MCH: 29.2 pg (ref 26.0–34.0)
MCHC: 34 g/dL (ref 30.0–36.0)
MCV: 86 fL (ref 80.0–100.0)
Platelets: 275 K/uL (ref 150–400)
RBC: 5.37 MIL/uL (ref 4.22–5.81)
RDW: 13.2 % (ref 11.5–15.5)
WBC: 14.3 K/uL — ABNORMAL HIGH (ref 4.0–10.5)
nRBC: 0 % (ref 0.0–0.2)

## 2023-10-26 LAB — COMPREHENSIVE METABOLIC PANEL WITH GFR
ALT: 22 U/L (ref 0–44)
AST: 21 U/L (ref 15–41)
Albumin: 4.2 g/dL (ref 3.5–5.0)
Alkaline Phosphatase: 46 U/L (ref 38–126)
Anion gap: 10 (ref 5–15)
BUN: 16 mg/dL (ref 6–20)
CO2: 25 mmol/L (ref 22–32)
Calcium: 9.3 mg/dL (ref 8.9–10.3)
Chloride: 103 mmol/L (ref 98–111)
Creatinine, Ser: 0.68 mg/dL (ref 0.61–1.24)
GFR, Estimated: >60 mL/min (ref >60)
Glucose, Bld: 118 mg/dL — ABNORMAL HIGH (ref 70–99)
Potassium: 3.5 mmol/L (ref 3.5–5.1)
Sodium: 138 mmol/L (ref 135–145)
Total Bilirubin: 1 mg/dL (ref 0.0–1.2)
Total Protein: 7.7 g/dL (ref 6.5–8.1)

## 2023-10-26 LAB — TROPONIN I (HIGH SENSITIVITY)
Troponin I (High Sensitivity): <2 ng/L (ref <18)
Troponin I (High Sensitivity): <2 ng/L (ref <18)

## 2023-10-26 LAB — LIPASE, BLOOD: Lipase: 27 U/L (ref 11–51)

## 2023-10-26 MED ORDER — LACTATED RINGERS IV BOLUS
1000.0000 mL | Freq: Once | INTRAVENOUS | Status: AC
  Administered 2023-10-26: 1000 mL via INTRAVENOUS

## 2023-10-26 MED ORDER — OXYCODONE-ACETAMINOPHEN 5-325 MG PO TABS: 1.0000 | ORAL_TABLET | ORAL | 0 refills | Status: AC | PRN

## 2023-10-26 MED ORDER — DIPHENHYDRAMINE HCL 50 MG/ML IJ SOLN
12.5000 mg | Freq: Once | INTRAMUSCULAR | Status: AC
  Administered 2023-10-26: 12.5 mg via INTRAVENOUS
  Filled 2023-10-26: qty 1

## 2023-10-26 MED ORDER — MORPHINE SULFATE (PF) 4 MG/ML IV SOLN
4.0000 mg | Freq: Once | INTRAVENOUS | Status: DC
  Filled 2023-10-26: qty 1

## 2023-10-26 MED ORDER — ONDANSETRON HCL 4 MG/2ML IJ SOLN
4.0000 mg | Freq: Once | INTRAMUSCULAR | Status: DC
  Filled 2023-10-26: qty 2

## 2023-10-26 MED ORDER — IOHEXOL 350 MG/ML SOLN
100.0000 mL | Freq: Once | INTRAVENOUS | Status: AC | PRN
  Administered 2023-10-26: 100 mL via INTRAVENOUS

## 2023-10-26 NOTE — ED Notes (Signed)
 Pt given DC instructions. Pt verbalized understanding of medications and follow up care. Pt ambulatory from  ED without difficulty. NAD noted at departure.

## 2023-10-26 NOTE — ED Triage Notes (Signed)
 Pt presented to ED with multiple complaints of chest tightness, shortness of breath, nausea, LLQ abd pain, fatigue, decreased appetite ongoing since 8/14. States was seen 8/14 for same, states they only did xrays on me and said it was a pulled muscle but I haven't been doing anything. Recently admitted for PE, on Eliquis , denies missed doses.

## 2023-10-26 NOTE — ED Provider Notes (Signed)
 [facility] Provider Note   Event Date/Time   First MD Initiated Contact with Patient 10/26/23 0206     (approximate) History  Abdominal Pain  HPI Jason Wise is a 28 y.o. male with a past medical history of bipolar depression and recently diagnosed acute pulmonary embolism on Eliquis  who presents complaining of left-sided chest tightness, shortness of breath, left lower quadrant abdominal pain, nausea, fatigue, and decreased appetite.  Patient states that this left-sided chest pain and shortness of breath has been present over the last 3 days however patient's left quadrant abdominal pain, fatigue nausea, decreased appetite began today.  Patient denies any fevers, nausea/vomiting/diarrhea, recent travel, or sick contacts ROS: Patient currently denies any vision changes, tinnitus, difficulty speaking, facial droop, sore throat, nausea/vomiting/diarrhea, dysuria, or weakness/numbness/paresthesias in any extremity   Physical Exam  Triage Vital Signs: ED Triage Vitals  Encounter Vitals Group     BP 10/25/23 2358 136/77     Girls Systolic BP Percentile --      Girls Diastolic BP Percentile --      Boys Systolic BP Percentile --      Boys Diastolic BP Percentile --      Pulse Rate 10/25/23 2358 (!) 107     Resp 10/25/23 2358 18     Temp 10/25/23 2358 98.5 F (36.9 C)     Temp Source 10/25/23 2358 Oral     SpO2 10/25/23 2358 98 %     Weight 10/26/23 0012 131 lb (59.4 kg)     Height 10/26/23 0012 5' 9 (1.753 m)     Head Circumference --      Peak Flow --      Pain Score 10/26/23 0011 7     Pain Loc --      Pain Education --      Exclude from Growth Chart --    Most recent vital signs: Vitals:   10/25/23 2358  BP: 136/77  Pulse: (!) 107  Resp: 18  Temp: 98.5 F (36.9 C)  SpO2: 98%   General: Awake, oriented x4. CV:  Good peripheral perfusion. Resp:  Normal effort. Abd:  No distention.  Left lower quadrant tenderness to  palpation Other:  Middle-aged well-developed, nourished Caucasian male resting comfortably in no acute distress ED Results / Procedures / Treatments  Labs (all labs ordered are listed, but only abnormal results are displayed) Labs Reviewed  COMPREHENSIVE METABOLIC PANEL WITH GFR - Abnormal; Notable for the following components:      Result Value   Glucose, Bld 118 (*)    All other components within normal limits  CBC - Abnormal; Notable for the following components:   WBC 14.3 (*)    All other components within normal limits  LIPASE, BLOOD  URINALYSIS, ROUTINE W REFLEX MICROSCOPIC  TROPONIN I (HIGH SENSITIVITY)  TROPONIN I (HIGH SENSITIVITY)   EKG ED ECG REPORT I, Artist MARLA Kerns, the attending physician, personally viewed and interpreted this ECG. Date: 10/26/2023 EKG Time: 0020 Rate: 96 Rhythm: normal sinus rhythm QRS Axis: normal Intervals: normal ST/T Wave abnormalities: normal Narrative Interpretation: no evidence of acute ischemia RADIOLOGY ED MD interpretation: CT of the abdomen and pelvis with contrast shows no acute or focal abnormality of the abdomen to explain left lower quadrant abdominal pain CT angiography of the chest shows left lower lobe pulmonary emboli and airspace disease with small left pleural effusion - All radiology independently interpreted and agree with radiology assessment Official radiology report(s): CT ABDOMEN PELVIS  W CONTRAST Result Date: 10/26/2023 EXAM: CT ABDOMEN AND PELVIS WITH CONTRAST 10/26/2023 03:52:08 AM TECHNIQUE: CT of the abdomen and pelvis was performed with the administration of intravenous contrast. Multiplanar reformatted images are provided for review. Automated exposure control, iterative reconstruction, and/or weight based adjustment of the mA/kV was utilized to reduce the radiation dose to as low as reasonably achievable. COMPARISON: CT angio chest 10/26/2023 03:44 AM. CLINICAL HISTORY: Left lower quadrant abdominal pain, fatigue  and decreased appetite. FINDINGS: LOWER CHEST: Left lower lobe airspace disease is again noted. A small left pleural effusion is present. LIVER: The liver is unremarkable. GALLBLADDER AND BILE DUCTS: Gallbladder is unremarkable. No biliary ductal dilatation. SPLEEN: No acute abnormality. PANCREAS: No acute abnormality. ADRENAL GLANDS: No acute abnormality. KIDNEYS, URETERS AND BLADDER: No stones in the kidneys or ureters. No hydronephrosis. No perinephric or periureteral stranding. Urinary bladder is unremarkable. GI AND BOWEL: Stomach demonstrates no acute abnormality. There is no bowel obstruction. No bowel wall thickening. PERITONEUM AND RETROPERITONEUM: No ascites. No free air. VASCULATURE: Left lower lobe pulmonary emboli again noted. LYMPH NODES: No lymphadenopathy. REPRODUCTIVE ORGANS: No acute abnormality. BONES AND SOFT TISSUES: No acute osseous abnormality. No focal soft tissue abnormality. IMPRESSION: 1. Left lower lobe pulmonary emboli and airspace disease, with small left pleural effusion. 2. No acute or focal abnormality of the abdomen to explain left lower quadrant abdominal pain. Electronically signed by: Lonni Necessary MD 10/26/2023 04:43 AM EDT RP Workstation: [id number]   CT Angio Chest Pulmonary Embolism (PE) W or WO Contrast Result Date: 10/26/2023 CLINICAL DATA:  Chest tightness, shortness of breath EXAM: CT ANGIOGRAPHY CHEST WITH CONTRAST TECHNIQUE: Multidetector CT imaging of the chest was performed using the standard protocol during bolus administration of intravenous contrast. Multiplanar CT image reconstructions and MIPs were obtained to evaluate the vascular anatomy. RADIATION DOSE REDUCTION: This exam was performed according to the departmental dose-optimization program which includes automated exposure control, adjustment of the mA and/or kV according to patient size and/or use of iterative reconstruction technique. CONTRAST:  OMNIPAQUE  IOHEXOL  350 MG/ML SOLN COMPARISON:   10/20/2023 FINDINGS: Cardiovascular: Heart is normal size. Aorta is normal caliber. Decreasing clot burden within the lungs bilaterally. Nonocclusive left lower lobe pulmonary embolus noted on image 98 of series 4. Right upper lobe pulmonary embolus noted on image 65. Overall clot burden has decreased since prior study. No evidence of right heart strain. Mediastinum/Nodes: No mediastinal, hilar, or axillary adenopathy. Trachea and esophagus are unremarkable. Thyroid  unremarkable. Lungs/Pleura: Airspace opacity posteriorly in the left lower lobe at the left lung base is new since prior study. This could reflect pulmonary infarct. Trace left pleural effusion. Right lung clear. Upper Abdomen: No acute findings Musculoskeletal: Chest wall soft tissues are unremarkable. No acute bony abnormality. Review of the MIP images confirms the above findings. IMPRESSION: Bilateral pulmonary emboli again noted in the left lower lobe and right upper lobe. Overall clot burden has decreased since prior study. No new pulmonary emboli seen. New airspace opacity posteriorly at the left lung base could reflect pulmonary infarct related to previously seen pulmonary emboli. Trace left pleural effusion. Electronically Signed   By: Franky Crease M.D.   On: 10/26/2023 04:00   PROCEDURES: Critical Care performed: No Procedures MEDICATIONS ORDERED IN ED: Medications  ondansetron  (ZOFRAN ) injection 4 mg (4 mg Intravenous Not Given 10/26/23 0341)  morphine  (PF) 4 MG/ML injection 4 mg (4 mg Intravenous Not Given 10/26/23 0340)  lactated ringers  bolus 1,000 mL (1,000 mLs Intravenous New Bag/Given 10/26/23 0328)  diphenhydrAMINE  (  BENADRYL ) injection 12.5 mg (12.5 mg Intravenous Given 10/26/23 0329)  iohexol  (OMNIPAQUE ) 350 MG/ML injection 100 mL (100 mLs Intravenous Contrast Given 10/26/23 0340)   IMPRESSION / MDM / ASSESSMENT AND PLAN / ED COURSE  I reviewed the triage vital signs and the nursing notes.                             The  patient is on the cardiac monitor to evaluate for evidence of arrhythmia and/or significant heart rate changes. Patient's presentation is most consistent with acute presentation with potential threat to life or bodily function. Patient presenting with chest pain and shortness of breath.  Presentation concerning for sequela PE.  Also considered pneumonia, ACS, pneumothorax, asthma, bronchitis however these were not consistent with presentation.  Unable to rule out patient with PERC score.  Patient in intermediate zone for Wells score.  Chest CT angiogram was preformed showing left lower lobe pulmonary embolism with airspace disease.    Patient also provided analgesia.  Remainer of labs remarkable for white blood cell count of 14.    Patient's vital signs were stable.  Patient has been taking Eliquis  on time and as prescribed.  Impression:   Pulmonary Embolism Left lower lobe pulmonary airspace disease Rx: Oxycodone  Plan:   Discharged home with PCP follow-up for further pain control   FINAL CLINICAL IMPRESSION(S) / ED DIAGNOSES   Final diagnoses:  Left-sided chest pain  Pleuritic pain  Left sided abdominal pain  Pulmonary embolism on left (HCC)   Rx / DC Orders   ED Discharge Orders          Ordered    oxyCODONE -acetaminophen  (PERCOCET) 5-325 MG tablet  Every 4 hours PRN        10/26/23 0501           Note:  This document was prepared using Dragon voice recognition software and may include unintentional dictation errors.   Nathasha Fiorillo K, MD 10/26/23 [phone]

## 2023-10-28 ENCOUNTER — Emergency Department: Payer: Self-pay

## 2023-10-28 ENCOUNTER — Other Ambulatory Visit: Payer: Self-pay

## 2023-10-28 ENCOUNTER — Emergency Department
Admission: EM | Admit: 2023-10-28 | Discharge: 2023-10-28 | Disposition: A | Discharge status: Home / Self Care | Payer: Self-pay | Attending: Emergency Medicine | Admitting: Emergency Medicine

## 2023-10-28 DIAGNOSIS — M79604 Pain in right leg: Secondary | ICD-10-CM | POA: Insufficient documentation

## 2023-10-28 DIAGNOSIS — R042 Hemoptysis: Secondary | ICD-10-CM | POA: Insufficient documentation

## 2023-10-28 DIAGNOSIS — Z7901 Long term (current) use of anticoagulants: Secondary | ICD-10-CM | POA: Insufficient documentation

## 2023-10-28 DIAGNOSIS — D72829 Elevated white blood cell count, unspecified: Secondary | ICD-10-CM | POA: Insufficient documentation

## 2023-10-28 HISTORY — DX: Other pulmonary embolism without acute cor pulmonale: I26.99

## 2023-10-28 LAB — CBC
HCT: 47.2 % (ref 39.0–52.0)
Hemoglobin: 15.5 g/dL (ref 13.0–17.0)
MCH: 29.1 pg (ref 26.0–34.0)
MCHC: 32.8 g/dL (ref 30.0–36.0)
MCV: 88.6 fL (ref 80.0–100.0)
Platelets: 337 K/uL (ref 150–400)
RBC: 5.33 MIL/uL (ref 4.22–5.81)
RDW: 13.1 % (ref 11.5–15.5)
WBC: 10.7 K/uL — ABNORMAL HIGH (ref 4.0–10.5)
nRBC: 0 % (ref 0.0–0.2)

## 2023-10-28 LAB — BASIC METABOLIC PANEL WITH GFR
Anion gap: 8 (ref 5–15)
BUN: 14 mg/dL (ref 6–20)
CO2: 27 mmol/L (ref 22–32)
Calcium: 9.7 mg/dL (ref 8.9–10.3)
Chloride: 103 mmol/L (ref 98–111)
Creatinine, Ser: 0.88 mg/dL (ref 0.61–1.24)
GFR, Estimated: >60 mL/min (ref >60)
Glucose, Bld: 94 mg/dL (ref 70–99)
Potassium: 4.1 mmol/L (ref 3.5–5.1)
Sodium: 138 mmol/L (ref 135–145)

## 2023-10-28 LAB — TROPONIN I (HIGH SENSITIVITY): Troponin I (High Sensitivity): <2 ng/L (ref <18)

## 2023-10-28 NOTE — ED Provider Notes (Signed)
 [facility] Provider Note    Event Date/Time   First MD Initiated Contact with Patient 10/28/23 1532     (approximate)   History   Chief Complaint Cough   HPI  Jason Wise is a 28 y.o. male with past medical history of bipolar disorder and pulmonary embolism on Eliquis  who presents to the ED complaining of hemoptysis.  Patient reports that he coughed up a small blood clot earlier today with some blood-tinged sputum.  He reports ongoing shortness of breath since diagnosis of pulmonary embolism 8 days ago, but has not had any pain in his chest.  He reports compliance with Eliquis , has not missed any doses.  He has not coughed up any more blood since coughing up a clot earlier today.  He does report some pain in the back of his right leg, moving up into his posterior thigh.  He has not noticed any swelling in this leg and denies any redness or warmth.     Physical Exam   Triage Vital Signs: ED Triage Vitals [10/28/23 1433]  Encounter Vitals Group     BP 122/76     Girls Systolic BP Percentile      Girls Diastolic BP Percentile      Boys Systolic BP Percentile      Boys Diastolic BP Percentile      Pulse Rate 100     Resp 19     Temp 98 F (36.7 C)     Temp Source Oral     SpO2 98 %     Weight 131 lb (59.4 kg)     Height 5' 9 (1.753 m)     Head Circumference      Peak Flow      Pain Score 6     Pain Loc      Pain Education      Exclude from Growth Chart     Most recent vital signs: Vitals:   10/28/23 1433 10/28/23 1855  BP: 122/76 114/73  Pulse: 100 75  Resp: 19 16  Temp: 98 F (36.7 C) 98.4 F (36.9 C)  SpO2: 98% 99%    Constitutional: Alert and oriented. Eyes: Conjunctivae are normal. Head: Atraumatic. Nose: No congestion/rhinnorhea. Mouth/Throat: Mucous membranes are moist.  Cardiovascular: Normal rate, regular rhythm. Grossly normal heart sounds.  2+ radial and DP pulses bilaterally. Respiratory: Normal respiratory effort.   No retractions. Lungs CTAB. Gastrointestinal: Soft and nontender. No distention. Musculoskeletal: Right calf tenderness noted with no erythema, edema, or warmth. Neurologic:  Normal speech and language. No gross focal neurologic deficits are appreciated.    ED Results / Procedures / Treatments   Labs (all labs ordered are listed, but only abnormal results are displayed) Labs Reviewed  CBC - Abnormal; Notable for the following components:      Result Value   WBC 10.7 (*)    All other components within normal limits  BASIC METABOLIC PANEL WITH GFR  TROPONIN I (HIGH SENSITIVITY)     EKG  ED ECG REPORT I, Carlin Palin, the attending physician, personally viewed and interpreted this ECG.   Date: 10/28/2023  EKG Time: 14:38  Rate: 93  Rhythm: normal sinus rhythm  Axis: RAD  Intervals:Incomplete RBBB  ST&T Change: Nonspecific T wave abnormality  RADIOLOGY Chest x-ray reviewed and interpreted by me with no infiltrate, edema, or effusion.  PROCEDURES:  Critical Care performed: No  Procedures   MEDICATIONS ORDERED IN ED: Medications - No data to display  IMPRESSION / MDM / ASSESSMENT AND PLAN / ED COURSE  I reviewed the triage vital signs and the nursing notes.                              28 y.o. male with past medical history of pulmonary embolism on Eliquis  and bipolar disorder who presents to the ED complaining of hemoptysis earlier today as well as some pain in his right calf.  Patient's presentation is most consistent with acute presentation with potential threat to life or bodily function.  Differential diagnosis includes, but is not limited to, massive hemoptysis, recurrent pulmonary embolism, anemia, electrolyte abnormality, AKI, DVT.  Patient nontoxic-appearing and in no acute distress, vital signs are unremarkable.  He has a picture on his phone of relatively small volume hemoptysis, has not had any ongoing hemoptysis since then.  His difficulty  breathing is chronic since recent diagnosis of pulmonary embolism, he is not in any respiratory distress here and maintaining oxygen saturations at 98% on room air.  Labs are reassuring with no significant anemia, leukocytosis, electrolyte abnormality, or AKI.  Troponin within normal limits and chest x-ray unremarkable.  He was seen in the ED 2 days ago with repeat CTA of his chest that showed improving clot burden from previous.  I do not feel repeat CTA is indicated at this time, we will check ultrasound of his right lower extremity given new calf pain and observe for ongoing hemoptysis.  Ultrasound of the right lower extremity shows no evidence of a DVT, patient was observed for over 5 hours here in the ED with no evidence of ongoing hemoptysis.  Patient appropriate for discharge home with outpatient follow-up, which has been scheduled with hematology for 2 days from now.  He was counseled to continue Eliquis  and to return to the ED for new or worsening symptoms, patient agrees with plan.      FINAL CLINICAL IMPRESSION(S) / ED DIAGNOSES   Final diagnoses:  Hemoptysis  Pain of right lower extremity     Rx / DC Orders   ED Discharge Orders     None        Note:  This document was prepared using Dragon voice recognition software and may include unintentional dictation errors.   Willo Dunnings, MD 10/28/23 [phone]

## 2023-10-28 NOTE — ED Triage Notes (Signed)
 First nurse note: Arrived by [facility] from home. Reports coughing up blood clot today. Seen here on Thursday and saturday with chest pain. C/o bilateral leg weakness and shakiness.   Denies chest pain at this time  Diagnosed with blood clots on 10/20/23  EMS vitals: 98% RA 145/75 b/p

## 2023-10-28 NOTE — ED Triage Notes (Signed)
 Pt sts that he was at home and coughed up a blood clot. Pt was recently admitted to [facility] for many clots in his lungs. Pt sts that he hasn't felt right since being D/C and that he is just always week and having CP.

## 2023-10-28 NOTE — ED Notes (Signed)
 Pt given DC instructions. Pt verbalized understanding of follow up care. Pt ambulatory from ED without difficulty. NAD noted at time of departure.

## 2023-10-30 ENCOUNTER — Inpatient Hospital Stay: Payer: Self-pay

## 2023-10-30 ENCOUNTER — Inpatient Hospital Stay: Payer: Self-pay | Admitting: Oncology

## 2023-11-13 ENCOUNTER — Telehealth: Payer: Self-pay

## 2023-11-13 NOTE — Telephone Encounter (Signed)
 The patient is scheduled to see Redell Cave, MD on 11-17-2023 as a new patient. Called and left a voice message to inquire about any previous cardiac history. Requested the patient to call back at their earliest convenience.

## 2023-11-14 ENCOUNTER — Inpatient Hospital Stay: Payer: Self-pay

## 2023-11-14 ENCOUNTER — Encounter: Payer: Self-pay | Admitting: Oncology

## 2023-11-14 ENCOUNTER — Inpatient Hospital Stay: Payer: Self-pay | Attending: Oncology | Admitting: Oncology

## 2023-11-14 VITALS — BP 120/74 | HR 85 | Temp 97.5°F | Resp 18 | Wt 133.6 lb

## 2023-11-14 DIAGNOSIS — Z7901 Long term (current) use of anticoagulants: Secondary | ICD-10-CM | POA: Insufficient documentation

## 2023-11-14 DIAGNOSIS — F17290 Nicotine dependence, other tobacco product, uncomplicated: Secondary | ICD-10-CM | POA: Insufficient documentation

## 2023-11-14 DIAGNOSIS — I2699 Other pulmonary embolism without acute cor pulmonale: Secondary | ICD-10-CM

## 2023-11-14 DIAGNOSIS — I2693 Single subsegmental pulmonary embolism without acute cor pulmonale: Secondary | ICD-10-CM | POA: Insufficient documentation

## 2023-11-14 DIAGNOSIS — Z139 Encounter for screening, unspecified: Secondary | ICD-10-CM

## 2023-11-15 NOTE — Progress Notes (Signed)
 Hematology/Oncology Consult note [phone] [phone]        REFERRING PROVIDER: Von Bellis, MD   CHIEF COMPLAINTS/REASON FOR VISIT:  Evaluation of pulmonary embolism    ASSESSMENT & PLAN:   Acute pulmonary embolism (HCC) Acute unprovoked bilateral pulmonary embolism I agree with anticoagulation with Eliquis  5 mg twice daily.  Recommend 6 months of anticoagulation.  After that plan to switch to low dose Eliquis  for long-term anticoagulation prophylaxis.  Check hypercoagulable workup.   Orders Placed This Encounter  Procedures   ANTIPHOSPHOLIPID SYNDROME PROF    Standing Status:   Future    Expected Date:   11/21/2023    Expiration Date:   02/19/2024   Beta-2-glycoprotein i abs, IgG/M/A    Standing Status:   Future    Expected Date:   11/21/2023    Expiration Date:   02/19/2024   Antithrombin III     Standing Status:   Future    Expected Date:   11/21/2023    Expiration Date:   02/19/2024   Factor 5 leiden    Standing Status:   Future    Expected Date:   11/21/2023    Expiration Date:   02/19/2024   Protein S, total and free    Standing Status:   Future    Expected Date:   11/21/2023    Expiration Date:   02/19/2024   Prothrombin gene mutation    Standing Status:   Future    Expected Date:   11/21/2023    Expiration Date:   02/19/2024   PNH Profile (-High Sensitivity)    Standing Status:   Future    Expected Date:   11/21/2023    Expiration Date:   02/19/2024   Protein C activity    Standing Status:   Future    Expected Date:   11/21/2023    Expiration Date:   02/19/2024   CMP (Cancer Center only)    Standing Status:   Future    Expected Date:   12/14/2023    Expiration Date:   03/13/2024   CBC with Differential (Cancer Center Only)    Standing Status:   Future    Expected Date:   12/14/2023    Expiration Date:   03/13/2024   Ambulatory referral to Social Work    Referral Priority:   Routine    Referral Type:   Consultation    Referral  Reason:   Specialty Services Required    Number of Visits Requested:   1    All questions were answered. The patient knows to call the clinic with any problems, questions or concerns.  Zelphia Cap, MD, PhD [facility] Hematology Oncology 11/14/2023   HISTORY OF PRESENTING ILLNESS:   Jason Wise is a  28 y.o.  male with PMH listed below was seen in consultation at the request of  Von Bellis, MD  for evaluation of pulmonary embolism   Discussed the use of AI scribe software for clinical note transcription with the patient, who gave verbal consent to proceed.   He has experienced sharp chest pain and shortness of breath for approximately four to five months. Initially, he attributed these symptoms to panic attacks. 10/20/2023, he went to ER. CTA showed 1. Moderate volume segmental and subsegmental pulmonary emboli scattered throughout both lungs, as delineated above. No findings of right heart strain. 2. No pneumonia, pulmonary edema, or pleural effusion.  10/26/2023 repeat CTA  Bilateral pulmonary emboli again noted in the left lower lobe and  right upper lobe. Overall clot burden has decreased since prior study. No new pulmonary emboli seen. New airspace opacity posteriorly at the left lung base could reflect pulmonary infarct related to previously seen pulmonary emboli. 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  He is currently on Eliquis  5 mg twice daily. He reports no bleeding events since starting the medication, although he notes his palms are 'really sweaty,' a symptom he has experienced before. He has gained approximately two pounds recently. He is in the process of securing Medicaid to assist with medication costs, as he currently lacks insurance  coverage.  His family history is significant for multiple cancers in his paternal grandmother, including lung, bone, liver, and another unspecified type of cancer. He vapes, which he acknowledges may have vascular implications. He denies any recent weight loss and reports that his breathing has improved since the initial event. He experiences soreness in the left side of his abdomen.   MEDICAL HISTORY:  Past Medical History:  Diagnosis Date   Bipolar disorder (HCC)    Panic attack    Pulmonary emboli (HCC)     SURGICAL HISTORY: Past Surgical History:  Procedure Laterality Date   testiular surgery      SOCIAL HISTORY: Social History   Socioeconomic History   Marital status: Single    Spouse name: Not on file   Number of children: Not on file   Years of education: Not on file   Highest education level: Not on file  Occupational History   Not on file  Tobacco Use   Smoking status: Former    Average packs/day: 1 pack/day for 8.6 years (8.6 ttl pk-yrs)    Types: Cigarettes    Start date: 2017   Smokeless tobacco: Never  Vaping Use   Vaping status: Some Days  Substance and Sexual Activity   Alcohol use: Not Currently   Drug use: No   Sexual activity: Yes  Other Topics Concern   Not on file  Social History Narrative   Not on file   Social Drivers of Health   Financial Resource Strain: Not on file  Food Insecurity: Food Insecurity Present (11/14/2023)   Hunger Vital Sign    Worried About Running Out of Food in the Last Year: Often true    Ran Out of Food in the Last Year: Often true  Transportation Needs: Unmet Transportation Needs (11/14/2023)   PRAPARE - Administrator, Civil Service (Medical): Yes    Lack of Transportation (Non-Medical): Yes  Physical Activity: Not on file  Stress: Not on file  Social Connections: Not on file  Intimate Partner Violence: Not At Risk (11/14/2023)   Humiliation, Afraid, Rape, and Kick questionnaire    Fear of Current or  Ex-Partner: No    Emotionally Abused: No    Physically Abused: No    Sexually Abused: No    FAMILY HISTORY: Family History  Problem Relation Age of Onset   Heart attack Father    Lung cancer Paternal Grandmother    Liver cancer Paternal Grandmother     ALLERGIES:  has no known allergies.  MEDICATIONS:  Current Outpatient Medications  Medication Sig Dispense Refill   acetaminophen  (TYLENOL ) 325 MG tablet Take 2 tablets (650 mg total) by mouth every 6 (six) hours as needed  for mild pain (pain score 1-3), fever, headache or moderate pain (pain score 4-6) (or Fever >/= 101).     APIXABAN  (ELIQUIS ) VTE STARTER PACK (10MG  AND 5MG ) Take as directed on package: start with two-5mg  tablets twice daily for 7 days. On day 8, switch to one-5mg  tablet twice daily. 74 each 5   OLANZapine  (ZYPREXA ) 2.5 MG tablet Take 0.5 tablets (1.25 mg total) by mouth at bedtime. 15 tablet 0   omeprazole  (PRILOSEC  OTC) 20 MG tablet Take 1 tablet (20 mg total) by mouth daily. 30 tablet 1   oxyCODONE -acetaminophen  (PERCOCET) 5-325 MG tablet Take 1 tablet by mouth every 4 (four) hours as needed for severe pain (pain score 7-10). 8 tablet 0   No current facility-administered medications for this visit.    Review of Systems  Constitutional:  Negative for appetite change, chills, fatigue and fever.  HENT:   Negative for hearing loss and voice change.   Eyes:  Negative for eye problems.  Respiratory:  Negative for chest tightness and cough.   Cardiovascular:  Negative for chest pain.  Gastrointestinal:  Negative for abdominal distention, abdominal pain and blood in stool.  Endocrine: Negative for hot flashes.  Genitourinary:  Negative for difficulty urinating and frequency.   Musculoskeletal:  Negative for arthralgias.  Skin:  Negative for itching and rash.  Neurological:  Negative for extremity weakness.  Hematological:  Negative for adenopathy.  Psychiatric/Behavioral:  Negative for confusion.    PHYSICAL  EXAMINATION:  Vitals:   11/14/23 1233  BP: 120/74  Pulse: 85  Resp: 18  Temp: (!) 97.5 F (36.4 C)  SpO2: 100%   Filed Weights   11/14/23 1233  Weight: 133 lb 9.6 oz (60.6 kg)    Physical Exam Constitutional:      General: He is not in acute distress. HENT:     Head: Normocephalic and atraumatic.  Eyes:     General: No scleral icterus. Cardiovascular:     Rate and Rhythm: Normal rate and regular rhythm.     Heart sounds: Normal heart sounds.  Pulmonary:     Effort: Pulmonary effort is normal. No respiratory distress.     Breath sounds: No wheezing.  Abdominal:     General: Bowel sounds are normal. There is no distension.     Palpations: Abdomen is soft.  Musculoskeletal:        General: No deformity. Normal range of motion.     Cervical back: Normal range of motion and neck supple.  Skin:    Findings: No erythema or rash.     Comments: Multiple skin tattoo.   Neurological:     Mental Status: He is alert and oriented to person, place, and time. Mental status is at baseline.     Cranial Nerves: No cranial nerve deficit.  Psychiatric:        Mood and Affect: Mood normal.     LABORATORY DATA:  I have reviewed the data as listed    Latest Ref Rng & Units 10/28/2023    2:35 PM 10/26/2023   12:29 AM 10/22/2023    4:59 AM  CBC  WBC 4.0 - 10.5 K/uL 10.7  14.3  10.8   Hemoglobin 13.0 - 17.0 g/dL 84.4  84.2  85.6   Hematocrit 39.0 - 52.0 % 47.2  46.2  43.0   Platelets 150 - 400 K/uL 337  275  240       Latest Ref Rng & Units 10/28/2023    2:35 PM 10/26/2023  12:29 AM 10/22/2023    4:59 AM  CMP  Glucose 70 - 99 mg/dL 94  881  868   BUN 6 - 20 mg/dL 14  16  13    Creatinine 0.61 - 1.24 mg/dL 9.11  9.31  9.23   Sodium 135 - 145 mmol/L 138  138  138   Potassium 3.5 - 5.1 mmol/L 4.1  3.5  3.5   Chloride 98 - 111 mmol/L 103  103  103   CO2 22 - 32 mmol/L 27  25  28    Calcium 8.9 - 10.3 mg/dL 9.7  9.3  9.0   Total Protein 6.5 - 8.1 g/dL  7.7    Total Bilirubin 0.0 -  1.2 mg/dL  1.0    Alkaline Phos 38 - 126 U/L  46    AST 15 - 41 U/L  21    ALT 0 - 44 U/L  22        RADIOGRAPHIC STUDIES: I have personally reviewed the radiological images as listed and agreed with the findings in the report. US  Venous Img Lower Unilateral Right Result Date: 10/28/2023 CLINICAL DATA:  Recent pulmonary embolism and right lower extremity edema. EXAM: RIGHT LOWER EXTREMITY VENOUS DOPPLER ULTRASOUND TECHNIQUE: Gray-scale sonography with graded compression, as well as color Doppler and duplex ultrasound were performed to evaluate the lower extremity deep venous systems from the level of the common femoral vein and including the common femoral, femoral, profunda femoral, popliteal and calf veins including the posterior tibial, peroneal and gastrocnemius veins when visible. The superficial great saphenous vein was also interrogated. Spectral Doppler was utilized to evaluate flow at rest and with distal augmentation maneuvers in the common femoral, femoral and popliteal veins. COMPARISON:  None Available. FINDINGS: Contralateral Common Femoral Vein: Respiratory phasicity is normal and symmetric with the symptomatic side. No evidence of thrombus. Normal compressibility. Common Femoral Vein: No evidence of thrombus. Normal compressibility, respiratory phasicity and response to augmentation. Saphenofemoral Junction: No evidence of thrombus. Normal compressibility and flow on color Doppler imaging. Profunda Femoral Vein: No evidence of thrombus. Normal compressibility and flow on color Doppler imaging. Femoral Vein: No evidence of thrombus. Normal compressibility, respiratory phasicity and response to augmentation. Popliteal Vein: No evidence of thrombus. Normal compressibility, respiratory phasicity and response to augmentation. Calf Veins: No evidence of thrombus. Normal compressibility and flow on color Doppler imaging. Superficial Great Saphenous Vein: No evidence of thrombus. Normal  compressibility. Venous Reflux:  None. Other Findings: No evidence of superficial thrombophlebitis or abnormal fluid collection. IMPRESSION: No evidence of right lower extremity deep venous thrombosis. Electronically Signed   By: Marcey Moan M.D.   On: 10/28/2023 17:28   DG Chest 2 View Result Date: 10/28/2023 CLINICAL DATA:  Shortness of breath, chest pain. EXAM: CHEST - 2 VIEW COMPARISON:  October 20, 2023. FINDINGS: The heart size and mediastinal contours are within normal limits. Both lungs are clear. The visualized skeletal structures are unremarkable. IMPRESSION: No active cardiopulmonary disease. Electronically Signed   By: Lynwood Landy Raddle M.D.   On: 10/28/2023 15:05   CT ABDOMEN PELVIS W CONTRAST Result Date: 10/26/2023 EXAM: CT ABDOMEN AND PELVIS WITH CONTRAST 10/26/2023 03:52:08 AM TECHNIQUE: CT of the abdomen and pelvis was performed with the administration of intravenous contrast. Multiplanar reformatted images are provided for review. Automated exposure control, iterative reconstruction, and/or weight based adjustment of the mA/kV was utilized to reduce the radiation dose to as low as reasonably achievable. COMPARISON: CT angio chest 10/26/2023 03:44 AM. CLINICAL HISTORY:  Left lower quadrant abdominal pain, fatigue and decreased appetite. FINDINGS: LOWER CHEST: Left lower lobe airspace disease is again noted. A small left pleural effusion is present. LIVER: The liver is unremarkable. GALLBLADDER AND BILE DUCTS: Gallbladder is unremarkable. No biliary ductal dilatation. SPLEEN: No acute abnormality. PANCREAS: No acute abnormality. ADRENAL GLANDS: No acute abnormality. KIDNEYS, URETERS AND BLADDER: No stones in the kidneys or ureters. No hydronephrosis. No perinephric or periureteral stranding. Urinary bladder is unremarkable. GI AND BOWEL: Stomach demonstrates no acute abnormality. There is no bowel obstruction. No bowel wall thickening. PERITONEUM AND RETROPERITONEUM: No ascites. No free air.  VASCULATURE: Left lower lobe pulmonary emboli again noted. LYMPH NODES: No lymphadenopathy. REPRODUCTIVE ORGANS: No acute abnormality. BONES AND SOFT TISSUES: No acute osseous abnormality. No focal soft tissue abnormality. IMPRESSION: 1. Left lower lobe pulmonary emboli and airspace disease, with small left pleural effusion. 2. No acute or focal abnormality of the abdomen to explain left lower quadrant abdominal pain. Electronically signed by: Lonni Necessary MD 10/26/2023 04:43 AM EDT RP Workstation: [id number]   CT Angio Chest Pulmonary Embolism (PE) W or WO Contrast Result Date: 10/26/2023 CLINICAL DATA:  Chest tightness, shortness of breath EXAM: CT ANGIOGRAPHY CHEST WITH CONTRAST TECHNIQUE: Multidetector CT imaging of the chest was performed using the standard protocol during bolus administration of intravenous contrast. Multiplanar CT image reconstructions and MIPs were obtained to evaluate the vascular anatomy. RADIATION DOSE REDUCTION: This exam was performed according to the departmental dose-optimization program which includes automated exposure control, adjustment of the mA and/or kV according to patient size and/or use of iterative reconstruction technique. CONTRAST:  OMNIPAQUE  IOHEXOL  350 MG/ML SOLN COMPARISON:  10/20/2023 FINDINGS: Cardiovascular: Heart is normal size. Aorta is normal caliber. Decreasing clot burden within the lungs bilaterally. Nonocclusive left lower lobe pulmonary embolus noted on image 98 of series 4. Right upper lobe pulmonary embolus noted on image 65. Overall clot burden has decreased since prior study. No evidence of right heart strain. Mediastinum/Nodes: No mediastinal, hilar, or axillary adenopathy. Trachea and esophagus are unremarkable. Thyroid  unremarkable. Lungs/Pleura: Airspace opacity posteriorly in the left lower lobe at the left lung base is new since prior study. This could reflect pulmonary infarct. Trace left pleural effusion. Right lung clear. Upper  Abdomen: No acute findings Musculoskeletal: Chest wall soft tissues are unremarkable. No acute bony abnormality. Review of the MIP images confirms the above findings. IMPRESSION: Bilateral pulmonary emboli again noted in the left lower lobe and right upper lobe. Overall clot burden has decreased since prior study. No new pulmonary emboli seen. New airspace opacity posteriorly at the left lung base could reflect pulmonary infarct related to previously seen pulmonary emboli. Trace left pleural effusion. Electronically Signed   By: Franky Crease M.D.   On: 10/26/2023 04:00   DG Shoulder Left Result Date: 10/23/2023 CLINICAL DATA:  Left shoulder pain EXAM: LEFT SHOULDER - 2+ VIEW COMPARISON:  None Available. FINDINGS: There is no evidence of fracture or dislocation. There is no evidence of arthropathy or other focal bone abnormality. Soft tissues are unremarkable. IMPRESSION: Negative. Electronically Signed   By: Greig Pique M.D.   On: 10/23/2023 22:06   DG Ribs Unilateral W/Chest Left Result Date: 10/23/2023 CLINICAL DATA:  Left rib pain EXAM: LEFT RIBS AND CHEST - 3+ VIEW COMPARISON:  None Available. FINDINGS: No fracture or other bone lesions are seen involving the ribs. There is no evidence of pneumothorax or pleural effusion. Both lungs are clear. Heart size and mediastinal contours are within normal limits. IMPRESSION:  Negative. Electronically Signed   By: Dorethia Molt M.D.   On: 10/23/2023 21:59   ECHOCARDIOGRAM COMPLETE Result Date: 10/22/2023    ECHOCARDIOGRAM REPORT   Patient Name:   Jason Wise Date of Exam: 10/21/2023 Medical Rec #:  [id number]      Height:       69.0 in Accession #:    [id number]     Weight:       131.0 lb Date of Birth:  12/15/1995       BSA:          1.726 m Patient Age:    28 years       BP:           130/77 mmHg Patient Gender: M              HR:           70 bpm. Exam Location:  [facility] Procedure: 2D Echo, Cardiac Doppler and Color Doppler (Both Spectral and Color             Flow Doppler were utilized during procedure). Indications:     Pulmonary Embolus I26.09  History:         Patient has no prior history of Echocardiogram examinations.                  Signs/Symptoms:Chest Pain.  Sonographer:     Thea Norlander RCS Referring Phys:  [id number] CORT ONEIDA MANA Diagnosing Phys: Marsa Dooms MD IMPRESSIONS  1. Left ventricular ejection fraction, by estimation, is 55 to 60%. The left ventricle has normal function. The left ventricle has no regional wall motion abnormalities. Left ventricular diastolic parameters were normal.  2. Right ventricular systolic function is normal. The right ventricular size is normal.  3. The mitral valve is normal in structure. Trivial mitral valve regurgitation. No evidence of mitral stenosis.  4. The aortic valve is normal in structure. Aortic valve regurgitation is not visualized. No aortic stenosis is present.  5. The inferior vena cava is normal in size with greater than 50% respiratory variability, suggesting right atrial pressure of 3 mmHg. FINDINGS  Left Ventricle: Left ventricular ejection fraction, by estimation, is 55 to 60%. The left ventricle has normal function. The left ventricle has no regional wall motion abnormalities. Strain was performed and the global longitudinal strain is indeterminate. The left ventricular internal cavity size was normal in size. There is no left ventricular hypertrophy. Left ventricular diastolic parameters were normal. Right Ventricle: The right ventricular size is normal. No increase in right ventricular wall thickness. Right ventricular systolic function is normal. Left Atrium: Left atrial size was normal in size. Right Atrium: Right atrial size was normal in size. Pericardium: There is no evidence of pericardial effusion. Mitral Valve: The mitral valve is normal in structure. Trivial mitral valve regurgitation. No evidence of mitral valve stenosis. Tricuspid Valve: The tricuspid valve is normal in structure.  Tricuspid valve regurgitation is trivial. No evidence of tricuspid stenosis. Aortic Valve: The aortic valve is normal in structure. Aortic valve regurgitation is not visualized. No aortic stenosis is present. Aortic valve peak gradient measures 6.6 mmHg. Pulmonic Valve: The pulmonic valve was normal in structure. Pulmonic valve regurgitation is not visualized. No evidence of pulmonic stenosis. Aorta: The aortic root is normal in size and structure. Venous: The inferior vena cava is normal in size with greater than 50% respiratory variability, suggesting right atrial pressure of 3 mmHg. IAS/Shunts: No atrial level shunt detected by color flow  Doppler. Additional Comments: 3D was performed not requiring image post processing on an independent workstation and was indeterminate.  LEFT VENTRICLE PLAX 2D LVIDd:         4.50 cm Diastology LVIDs:         3.10 cm LV e' medial:    16.50 cm/s LV PW:         0.80 cm LV E/e' medial:  8.0 LV IVS:        0.60 cm LV e' lateral:   20.50 cm/s                        LV E/e' lateral: 6.4  RIGHT VENTRICLE             IVC RV S prime:     15.10 cm/s  IVC diam: 2.20 cm TAPSE (M-mode): 2.2 cm LEFT ATRIUM             Index        RIGHT ATRIUM           Index LA diam:        2.70 cm 1.56 cm/m   RA Area:     10.40 cm LA Vol (A2C):   20.2 ml 11.70 ml/m  RA Volume:   22.10 ml  12.80 ml/m LA Vol (A4C):   35.4 ml 20.51 ml/m LA Biplane Vol: 29.2 ml 16.92 ml/m  AORTIC VALVE AV Vmax:      128.00 cm/s AV Peak Grad: 6.6 mmHg LVOT Vmax:    117.00 cm/s LVOT Vmean:   75.200 cm/s LVOT VTI:     0.216 m MITRAL VALVE MV Area (PHT): 6.17 cm     SHUNTS MV Decel Time: 123 msec     Systemic VTI: 0.22 m MV E velocity: 132.00 cm/s MV A velocity: 63.50 cm/s MV E/A ratio:  2.08 Marsa Dooms MD Electronically signed by Marsa Dooms MD Signature Date/Time: 10/22/2023/1:22:59 PM    Final    US  Venous Img Lower Bilateral (DVT) Result Date: 10/20/2023 CLINICAL DATA:  [id number] PE (pulmonary  thromboembolism) (HCC) [id number] EXAM: BILATERAL LOWER EXTREMITY VENOUS DOPPLER ULTRASOUND TECHNIQUE: Gray-scale sonography with graded compression, as well as color Doppler and duplex ultrasound were performed to evaluate the lower extremity deep venous systems from the level of the common femoral vein and including the common femoral, femoral, profunda femoral, popliteal and calf veins including the posterior tibial, peroneal and gastrocnemius veins when visible. The superficial great saphenous vein was also interrogated. Spectral Doppler was utilized to evaluate flow at rest and with distal augmentation maneuvers in the common femoral, femoral and popliteal veins. COMPARISON:  None Available. FINDINGS: RIGHT LOWER EXTREMITY Common Femoral Vein: No evidence of thrombus. Normal compressibility, respiratory phasicity and response to augmentation. Saphenofemoral Junction: No evidence of thrombus. Normal compressibility and flow on color Doppler imaging. Profunda Femoral Vein: No evidence of thrombus. Normal compressibility and flow on color Doppler imaging. Femoral Vein: No evidence of thrombus. Normal compressibility, respiratory phasicity and response to augmentation. Popliteal Vein: No evidence of thrombus. Normal compressibility, respiratory phasicity and response to augmentation. Calf Veins: No evidence of thrombus. Normal compressibility and flow on color Doppler imaging. Superficial Great Saphenous Vein: No evidence of thrombus. Normal compressibility. Other Findings:  None. LEFT LOWER EXTREMITY Common Femoral Vein: No evidence of thrombus. Normal compressibility, respiratory phasicity and response to augmentation. Saphenofemoral Junction: No evidence of thrombus. Normal compressibility and flow on color Doppler imaging. Profunda Femoral Vein: No evidence of thrombus. Normal compressibility  and flow on color Doppler imaging. Femoral Vein: No evidence of thrombus. Normal compressibility, respiratory phasicity and  response to augmentation. Popliteal Vein: No evidence of thrombus. Normal compressibility, respiratory phasicity and response to augmentation. Calf Veins: No evidence of thrombus. Normal compressibility and flow on color Doppler imaging. Superficial Great Saphenous Vein: No evidence of thrombus. Normal compressibility. Other Findings:  None. IMPRESSION: Negative for deep venous thrombosis within both legs. Electronically Signed   By: Rogelia Myers M.D.   On: 10/20/2023 19:13   CT Angio Chest PE W/Cm &/Or Wo Cm Result Date: 10/20/2023 CLINICAL DATA:  Pulmonary embolism (PE) suspected, low to intermediate prob, positive D-dimer EXAM: CT ANGIOGRAPHY CHEST WITH CONTRAST TECHNIQUE: Multidetector CT imaging of the chest was performed using the standard protocol during bolus administration of intravenous contrast. Multiplanar CT image reconstructions and MIPs were obtained to evaluate the vascular anatomy. RADIATION DOSE REDUCTION: This exam was performed according to the departmental dose-optimization program which includes automated exposure control, adjustment of the mA and/or kV according to patient size and/or use of iterative reconstruction technique. CONTRAST:  75mL OMNIPAQUE  IOHEXOL  350 MG/ML SOLN COMPARISON:  None Available. FINDINGS: Pulmonary Embolism: Occlusive and nonocclusive emboli within the segmental branches of the right upper lobe. Distal subsegmental embolus within the medial basal right lower lobe. Occlusive segmental and subsegmental emboli within the basilar segments of the left lower lobe with a proximal segmental embolus in the superior segment of the left lower lobe. No findings of right heart strain. Cardiovascular: No cardiomegaly or pericardial effusion.No aortic aneurysm. Mediastinum/Nodes: No mediastinal mass. Small amount of nonexpansile soft tissue in the anterior mediastinum, likely residual thymic tissue No mediastinal, hilar, or axillary lymphadenopathy. Lungs/Pleura: The midline  trachea and bronchi are patent. No focal airspace consolidation, pleural effusion, or pneumothorax. Musculoskeletal: No acute fracture or destructive bone lesion. Upper Abdomen: No acute abnormality in the partially visualized upper abdomen. Review of the MIP images confirms the above findings. IMPRESSION: 1. Moderate volume segmental and subsegmental pulmonary emboli scattered throughout both lungs, as delineated above. No findings of right heart strain. 2. No pneumonia, pulmonary edema, or pleural effusion. Critical Value/emergent results were called by telephone at the time of interpretation on 10/20/2023 at 4:42 pm to provider Dr Dorothyann, who verbally acknowledged these results. Electronically Signed   By: Rogelia Myers M.D.   On: 10/20/2023 16:44   DG Chest 2 View Result Date: 10/20/2023 CLINICAL DATA:  Chest pain EXAM: CHEST - 2 VIEW COMPARISON:  09/29/2023 FINDINGS: The heart size and mediastinal contours are within normal limits. Both lungs are clear. The visualized skeletal structures are unremarkable. No pneumothorax. IMPRESSION: No active cardiopulmonary disease. Electronically Signed   By: Franky Crease M.D.   On: 10/20/2023 14:06   DG Chest 2 View Result Date: 09/29/2023 EXAM: 2 VIEW(S) XRAY OF THE CHEST 09/29/2023 09:51:33 PM COMPARISON: 06/24/2018 CLINICAL HISTORY: CP. PER ER NOTE; Pt to ED via POV c/o central CP that radiates to the left chest x3 weeks. Got worse today. Endorses some generalized weakness, denies SOB, fevers, dizziness. FINDINGS: LUNGS AND PLEURA: No focal pulmonary opacity. No pulmonary edema. No pleural effusion. No pneumothorax. HEART AND MEDIASTINUM: No acute abnormality of the cardiac and mediastinal silhouettes. BONES AND SOFT TISSUES: No acute osseous abnormality. IMPRESSION: 1. No acute process. Electronically signed by: Pinkie Pebbles MD 09/29/2023 09:54 PM EDT RP Workstation: [id number]

## 2023-11-15 NOTE — Assessment & Plan Note (Signed)
 Acute unprovoked bilateral pulmonary embolism I agree with anticoagulation with Eliquis  5 mg twice daily.  Recommend 6 months of anticoagulation.  After that plan to switch to low dose Eliquis  for long-term anticoagulation prophylaxis.  Check hypercoagulable workup.

## 2023-11-17 ENCOUNTER — Ambulatory Visit: Payer: Self-pay | Attending: Cardiology | Admitting: Cardiology

## 2023-11-19 ENCOUNTER — Telehealth: Payer: Self-pay | Admitting: Pharmacy Technician

## 2023-11-19 NOTE — Telephone Encounter (Signed)
 Submitted Eliquis  PAP application to [facility].  Requested medication be shipped to patient's home.  Dickey DOROTHA Fritter Patient Pharmacologist [facility]

## 2023-11-20 ENCOUNTER — Inpatient Hospital Stay: Payer: Self-pay

## 2023-11-20 NOTE — Progress Notes (Signed)
 [facility] Clinical Social Work  Initial Assessment   Jason Wise is a 28 y.o. year old male contacted by phone. Clinical Social Work was referred by medical provider for SDOH needs.   SDOH (Social Determinants of Health) assessments performed: Yes   SDOH Screenings   Food Insecurity: Food Insecurity Present (11/14/2023)  Housing: High Risk (11/14/2023)  Transportation Needs: Unmet Transportation Needs (11/14/2023)  Utilities: At Risk (11/14/2023)  Depression (PHQ2-9): Medium Risk (11/14/2023)  Tobacco Use: Medium Risk (11/14/2023)    PHQ 2/9:    11/14/2023    2:17 PM 11/14/2023   12:32 PM  Depression screen PHQ 2/9  Decreased Interest 2 0  Down, Depressed, Hopeless 1 0  PHQ - 2 Score 3 0  Altered sleeping 1   Tired, decreased energy 1   Change in appetite 1   Feeling bad or failure about yourself  0   Trouble concentrating 1   Moving slowly or fidgety/restless 0   Suicidal thoughts 0   PHQ-9 Score 7      Distress Screen completed: No     No data to display            Family/Social Information:  Housing Arrangement: patient lives alone. Family members/support persons in your life? Family and Friends.  Patient's mother and grandfather are supportive.  Patient also has a girlfriend. Transportation concerns: no, patient denied during session.  Employment: Unemployed due to illness. Income source: Supported by [employer] and Friends Financial concerns: Yes, due to illness and/or loss of work during treatment Type of concern: Utilities, Government social research officer, Transportation, and [employer] access concerns: yes Religious or spiritual practice: No Advanced directives: No Services Currently in place:  Medicaid.  Coping/ Adjustment to diagnosis: Patient understands treatment plan and what happens next? 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 Patient enjoys exercise  and time with family/ friends Current coping skills/ strengths: Capable of independent living , Manufacturing systems engineer , General fund of knowledge , Motivation for treatment/growth , and Supportive family/friends     SUMMARY: Current SDOH Barriers:  Financial constraints related to no income.  Clinical Social Work Clinical Goal(s):  Explore community resource options for unmet needs related to:  Financial Strain   Interventions: Discussed common feeling and emotions when being diagnosed with cancer, and the importance of support during treatment Informed patient of the support team roles and support services at [facility] Provided CSW contact information and encouraged patient to call with any questions or concerns Provided patient with information about social security disability.  Patient would like CSW to make referral to the [facility].  Will also mail patient food bank list and CSW contact information.    Follow Up Plan: CSW will follow-up with patient by phone  Patient verbalizes understanding of plan: Yes    Macario CHRISTELLA Au, LCSW Clinical Social Worker [facility]

## 2023-11-21 ENCOUNTER — Inpatient Hospital Stay: Payer: Self-pay

## 2023-11-21 DIAGNOSIS — I2699 Other pulmonary embolism without acute cor pulmonale: Secondary | ICD-10-CM

## 2023-11-21 LAB — ANTITHROMBIN III: AntiThromb III Func: 112 % (ref 75–120)

## 2023-11-22 LAB — PROTEIN S, TOTAL AND FREE
Protein S Ag, Free: 106 % (ref 61–136)
Protein S Ag, Total: 95 % (ref 60–150)

## 2023-11-22 LAB — PROTEIN C ACTIVITY: Protein C Activity: 105 % (ref 73–180)

## 2023-11-23 LAB — BETA-2-GLYCOPROTEIN I ABS, IGG/M/A
Beta-2 Glyco I IgG: <9 GPI IgG units (ref 0–20)
Beta-2-Glycoprotein I IgA: <9 GPI IgA units (ref 0–25)
Beta-2-Glycoprotein I IgM: <9 GPI IgM units (ref 0–32)

## 2023-11-24 ENCOUNTER — Telehealth: Payer: Self-pay | Admitting: Pharmacy Technician

## 2023-11-24 NOTE — Telephone Encounter (Signed)
 Spoke with Jenkins at [employer] and she stated that patient has been approved for Eliquis .  Jenkins stated that [facility] has attempted to contact patient several times to schedule shipment of Eliquis  but has been unsuccessful in speaking to patient.    Contacted patient and conducted a conference call with [employer] to arrange shipment of the Eliquis .  Patient to receive medication on 11/25/23.  Patient also stated that address has changed and asked me to update in [facility].  I have updated patient's address.  Jason Wise Patient Marketing executive [facility]

## 2023-11-24 NOTE — Telephone Encounter (Signed)
 Letter received from [facility] stating that patient was approved for Eliquis  patient assistance from 11/21/23 to 11/19/24

## 2023-11-25 LAB — ANTIPHOSPHOLIPID SYNDROME PROF
Anticardiolipin IgG: <9 GPL U/mL (ref 0–14)
Anticardiolipin IgM: <9 [MPL'U]/mL (ref 0–12)
DRVVT: 42 s (ref 0.0–47.0)
PTT Lupus Anticoagulant: 34.3 s (ref 0.0–43.5)

## 2023-11-25 NOTE — Progress Notes (Deleted)
   Cardiology Clinic Note   Date: 11/25/2023 ID: Jason Wise, DOB 1995/12/26, MRN [id number]  Primary Care Provider: Pcp, No  Primary Cardiologist:  None  Chief Complaint   Jason Wise is a 28 y.o. male who presents to the clinic today for ***  Patient Profile      Past medical history significant for: PE. CTA chest PE protocol 10/20/2023: Occlusive and nonocclusive emboli within the segmental branches of the right upper lobe.  Distal subsegmental embolus within the medial basal right lower lobe.  Occlusive subsegmental and subsegmental emboli within the basilar segments of the left lower lobe with a proximal segmental embolus in the superior segment of the left lower lobe.  No findings of right heart strain. Echo 10/21/2023: EF 55 to 60%.  No RWMA.  Normal diastolic parameters.  Normal RV size/function.  Trivial MR. CTA chest PE protocol 10/26/2023: Bilateral pulmonary emboli noted in the left lower lobe and right upper lobe.  Overall clot burden has decreased since prior study.  No new PE seen. Bipolar depression.  In summary, patient presented to the ED on 10/20/2023 with central chest pain radiating to the left and worsening with deep breathing.  He also reported experiencing confusion and shortness of breath.  D-dimer was elevated.  Patient underwent CTA chest which demonstrated PE as detailed above.  Venous ultrasound of lower extremity was negative for DVT.  Echo demonstrated normal LV/RV function.  Patient was discharged on 10/22/2023.  He presented to the ED on 10/23/2023 with extreme pain in the left shoulder.  Repeat CTA demonstrated bilateral PE as noted previously with a decreased overall clot burden.  Patient followed up with hematology on 11/14/2023.  It was recommended he continue current dose of Eliquis  for 6 months before switching to low-dose Eliquis  for long-term prophylaxis  Patient was last seen in the office by ***     History of Present Illness    Today, patient  ***  Tobacco***EtOH***family history***  ***  ROS: All other systems reviewed and are otherwise negative except as noted in History of Present Illness.  EKGs/Labs Reviewed        10/26/2023: ALT 22; AST 21 10/28/2023: BUN 14; Creatinine, Ser 0.88; Potassium 4.1; Sodium 138   10/28/2023: Hemoglobin 15.5; WBC 10.7   No results found for requested labs within last 365 days.   No results found for requested labs within last 365 days.  ***  Risk Assessment/Calculations    {Does this patient have ATRIAL FIBRILLATION?[phone]} No BP recorded.  {Refresh Note OR Click here to enter BP  :1}***        Physical Exam    VS:  There were no vitals taken for this visit. , BMI There is no height or weight on file to calculate BMI.  GEN: Well nourished, well developed, in no acute distress. Neck: No JVD or carotid bruits. Cardiac: *** RRR. *** No murmur. No rubs or gallops.   Respiratory:  Respirations regular and unlabored. Clear to auscultation without rales, wheezing or rhonchi. GI: Soft, nontender, nondistended. Extremities: Radials/DP/PT 2+ and equal bilaterally. No clubbing or cyanosis. No edema ***  Skin: Warm and dry, no rash. Neuro: Strength intact.  Assessment & Plan   ***  Disposition: ***     {Are you ordering a CV Procedure (e.g. stress test, cath, DCCV, TEE, etc)?   Press F2        :[id number]}   Signed, Barnie HERO. Shakeda Pearse, DNP, NP-C

## 2023-11-26 ENCOUNTER — Telehealth: Payer: Self-pay | Admitting: Oncology

## 2023-11-26 ENCOUNTER — Other Ambulatory Visit: Payer: Self-pay

## 2023-11-26 DIAGNOSIS — I2699 Other pulmonary embolism without acute cor pulmonale: Secondary | ICD-10-CM

## 2023-11-26 LAB — PNH PROFILE (-HIGH SENSITIVITY)

## 2023-11-26 LAB — FACTOR 5 LEIDEN

## 2023-11-26 NOTE — Telephone Encounter (Signed)
 Per secure chat:  Rolland Maclachlan Patient Jason Wise MRN [id number] had a [facility] ordered and drawn on 11/21/23. This test was cancelled by [facility] due to the age of sample and will need to be reordered and drawn again. Thanks!  This test cannot be drawn on Friday. The phlebotomists are aware and have been reminded. This particular patient was supposed to have labs drawn a couple of days before but got pushed until Friday. Thanks    Per MD, have pt come back for labs.   I called and left vm for pt to call back to schedule his lab again (preferably on a Monday or Tuesday)

## 2023-11-27 LAB — PROTHROMBIN GENE MUTATION

## 2023-11-28 ENCOUNTER — Ambulatory Visit: Payer: MEDICAID | Attending: Student | Admitting: Student

## 2023-11-28 NOTE — Telephone Encounter (Signed)
 Can you do a follow up call to see if he can come for labs early next week please

## 2024-02-18 ENCOUNTER — Inpatient Hospital Stay: Payer: Self-pay | Attending: Oncology

## 2024-02-18 ENCOUNTER — Encounter: Payer: Self-pay | Admitting: Oncology

## 2024-02-18 ENCOUNTER — Ambulatory Visit: Payer: Self-pay | Admitting: Oncology
